# Patient Record
Sex: Male | Born: 1965 | Race: White | Hispanic: No | Marital: Married | Smoking: Never smoker
Health system: Southern US, Community
[De-identification: ages and names within clinical notes are randomized; demographics above are authoritative.]

## PROBLEM LIST (undated history)

## (undated) DIAGNOSIS — N200 Calculus of kidney: Secondary | ICD-10-CM

## (undated) DIAGNOSIS — F329 Major depressive disorder, single episode, unspecified: Secondary | ICD-10-CM

## (undated) DIAGNOSIS — F32A Depression, unspecified: Secondary | ICD-10-CM

## (undated) DIAGNOSIS — I10 Essential (primary) hypertension: Secondary | ICD-10-CM

## (undated) DIAGNOSIS — F419 Anxiety disorder, unspecified: Secondary | ICD-10-CM

## (undated) HISTORY — PX: KIDNEY STONE SURGERY: SHX686

## (undated) HISTORY — DX: Anxiety disorder, unspecified: F41.9

## (undated) HISTORY — DX: Depression, unspecified: F32.A

## (undated) HISTORY — DX: Major depressive disorder, single episode, unspecified: F32.9

---

## 2000-04-22 ENCOUNTER — Encounter: Admission: RE | Admit: 2000-04-22 | Discharge: 2000-04-22 | Payer: Self-pay

## 2002-02-18 ENCOUNTER — Encounter: Payer: Self-pay | Admitting: Orthotics

## 2002-02-18 ENCOUNTER — Encounter: Admission: RE | Admit: 2002-02-18 | Discharge: 2002-02-18 | Payer: Self-pay | Admitting: Orthotics

## 2002-03-18 ENCOUNTER — Encounter: Payer: Self-pay | Admitting: Orthopedic Surgery

## 2002-03-18 ENCOUNTER — Ambulatory Visit (HOSPITAL_COMMUNITY): Admission: RE | Admit: 2002-03-18 | Discharge: 2002-03-18 | Payer: Self-pay | Admitting: Orthopedic Surgery

## 2002-04-10 ENCOUNTER — Ambulatory Visit (HOSPITAL_BASED_OUTPATIENT_CLINIC_OR_DEPARTMENT_OTHER): Admission: RE | Admit: 2002-04-10 | Discharge: 2002-04-10 | Payer: Self-pay | Admitting: Orthopedic Surgery

## 2010-06-28 ENCOUNTER — Encounter: Payer: Self-pay | Admitting: Emergency Medicine

## 2010-06-28 ENCOUNTER — Ambulatory Visit
Admission: RE | Admit: 2010-06-28 | Discharge: 2010-06-28 | Payer: Self-pay | Source: Home / Self Care | Admitting: Emergency Medicine

## 2010-06-28 DIAGNOSIS — I498 Other specified cardiac arrhythmias: Secondary | ICD-10-CM | POA: Insufficient documentation

## 2010-06-29 ENCOUNTER — Ambulatory Visit
Admission: RE | Admit: 2010-06-29 | Discharge: 2010-06-29 | Payer: Self-pay | Source: Home / Self Care | Admitting: Family Medicine

## 2010-06-30 ENCOUNTER — Telehealth (INDEPENDENT_AMBULATORY_CARE_PROVIDER_SITE_OTHER): Payer: Self-pay | Admitting: *Deleted

## 2010-07-06 NOTE — Assessment & Plan Note (Signed)
Summary: POSS SINUS INFECTION, HIGH HEART RATE/WSE   Vital Signs:  Patient Profile:   45 Years Old Male CC:      sinus infection? Height:     73 inches Weight:      303 pounds O2 Sat:      95 % O2 treatment:    Room Air Temp:     98.5 degrees F oral Pulse rate:   160 / minute Resp:     16 per minute BP sitting:   139 / 91  (left arm) Cuff size:   large  Vitals Entered By: Lajean Saver RN (June 28, 2010 7:49 PM)                  Updated Prior Medication List: * OTC SINUS MEDICATION- NAME UNKNOWN   Current Allergies: No known allergies History of Present Illness History from: patient Chief Complaint: sinus infection? History of Present Illness: 45 Years Old Male complains of onset of cold symptoms for 4 days.  Cline has been using OTC cold meds, Benedryl, and Sudafed which is helping a little bit.  He went to [facility] but they said his HR was too high for them to treat him and sent him here.  He doesn't feel any palpitations, CP, SOB, anxiety, chest tightness, light-headedness.  He had no idea that his heart was beating fast.  He has been told in the past that his heartrate has gone up into the low 100's especially when sick.  All that he feels now is head congestion, mild coughing, feverish (has fluctuated today from 97-100), mild fatigue.  No N/V.  He hasn't eaten or had much to drink today.  REVIEW OF SYSTEMS Constitutional Symptoms      Denies fever, chills, night sweats, weight loss, weight gain, and fatigue.  Eyes       Denies change in vision, eye pain, eye discharge, glasses, contact lenses, and eye surgery. Ear/Nose/Throat/Mouth       Complains of ear pain, dizziness, sinus problems, and sore throat.      Denies hearing loss/aids, change in hearing, ear discharge, frequent runny nose, frequent nose bleeds, hoarseness, and tooth pain or bleeding.      Comments: congestion Respiratory       Complains of shortness of breath.      Denies dry cough, productive  cough, wheezing, asthma, bronchitis, and emphysema/COPD.  Cardiovascular       Denies murmurs, chest pain, and tires easily with exhertion.    Gastrointestinal       Denies stomach pain, nausea/vomiting, diarrhea, constipation, blood in bowel movements, and indigestion. Genitourniary       Denies painful urination, kidney stones, and loss of urinary control. Neurological       Complains of headaches.      Denies paralysis, seizures, and fainting/blackouts. Musculoskeletal       Denies muscle pain, joint pain, joint stiffness, decreased range of motion, redness, swelling, muscle weakness, and gout.  Skin       Denies bruising, unusual mles/lumps or sores, and hair/skin or nail changes.  Psych       Denies mood changes, temper/anger issues, anxiety/stress, speech problems, depression, and sleep problems. Other Comments: x 4 days.    Past History:  Past Medical History: Unremarkable  Past Surgical History: Vasectomy Ulcer  Social History: Never Smoked Alcohol use-no Drug use-no Married Smoking Status:  never Drug Use:  no Physical Exam General appearance: well developed, well nourished, no acute distress, +diaphoresis Head:  normocephalic, atraumatic Pupils: PERRLA EOMI Ears: normal, no lesions or deformities Nasal: mucosa pink, nonedematous, no septal deviation, turbinates normal Oral/Pharynx: tongue normal, posterior pharynx without erythema or exudate Neck: neck supple,  trachea midline, no masses.  No carotid bruits Chest/Lungs: no rales, wheezes, or rhonchi bilateral, breath sounds equal without effort Heart: regular rate and tachycardic (about 120 bpm) no murmur Abdomen: soft, non-tender without obvious organomegaly.  No AAA felt but patient is obese. Extremities: FROM all extremities Neurological: no gross deficit Skin: no obvious rashes or lesions MSE: oriented to time, place, and person Assessment New Problems: ATRIAL TACHYCARDIA (ICD-427.89) UPPER  RESPIRATORY INFECTION, ACUTE (ICD-465.9)   Plan New Medications/Changes: AMOXICILLIN 875 MG TABS (AMOXICILLIN) 1 by mouth two times a day for 7 days  #14 x 0, 06/28/2010, Hoyt Koch MD  New Orders: Rocephin  250mg  [Z6109] Admin of Therapeutic Inj  intramuscular or subcutaneous [96372] New Patient Level IV [99204] Pulse Oximetry (single measurment) [94760] EKG w/ Interpretation [93000] Planning Comments:   Take the prescribed antibiotic as instructed.  A gram of Rocephin was also given in clinic. Use nasal saline solution (over the counter) at least 3 times a day.  Avoid cough and cold medicines for now since your HR is high.  An EKG was done with was normal other than a HR=120.  Can take tylenol every 6 hours or motrin every 8 hours for pain or fever.  I have asked the patient to return first thing in the morning.  I suggest that labwork (CBC, BMP, TSH) is done at that time as well as a possible CXR.  I have given the patient strict instructions for going to the ER for any worsening of symptoms (CP, SOB, etc).  His high HR appears to be mild SVT and is explained by his current illness/sinusitis and partial dehydration from today.  I don't believe it is a cardiac issue at this point, but we will likely have him follow up with cardiology as a precaution.  If it continues, his PCP may consider a CCB for prophylaxis.  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  We will not charge another copay when he returns tomorrow morning.   The patient and/or caregiver has been counseled thoroughly with regard to medications prescribed including dosage, schedule, interactions, rationale for use, and possible side effects and they verbalize understanding.  Diagnoses and expected course of recovery discussed and will return if not improved as expected or if the condition worsens. Patient and/or caregiver verbalized understanding.  Prescriptions: AMOXICILLIN 875 MG TABS (AMOXICILLIN) 1 by mouth two times  a day for 7 days  #14 x 0   Entered and Authorized by:   Hoyt Koch MD   Signed by:   Hoyt Koch MD on 06/28/2010   Method used:   Print then Give to Patient   RxID:   [id number]   Medication Administration  Injection # 1:    Medication: Rocephin  250mg     Diagnosis: UPPER RESPIRATORY INFECTION, ACUTE (ICD-465.9)    Route: IM    Site: RUOQ gluteus    Exp Date: 10/02/2012    Lot #: [id number]    Mfr: Sandoz    Comments: 1gm given    Patient tolerated injection without complications    Given by: Lajean Saver RN (June 28, 2010 8:20 PM)  Orders Added: 1)  Rocephin  250mg  [J0696] 2)  Admin of Therapeutic Inj  intramuscular or subcutaneous [96372] 3)  New Patient Level IV [99204] 4)  Pulse  Oximetry (single measurment) [94760] 5)  EKG w/ Interpretation [93000]

## 2010-07-06 NOTE — Assessment & Plan Note (Signed)
Summary: RE-CHECK RAPID HEART RATE,SICK/WSE (rm 3)   Vital Signs:  Patient Profile:   45 Years Old Male CC:      re-check HR and sinus infection Height:     73 inches Weight:      302 pounds O2 Sat:      96 % O2 treatment:    Room Air Temp:     98.7 degrees F oral Pulse rate:   90 / minute Resp:     14 per minute BP sitting:   128 / 88  (left arm) Cuff size:   large  Vitals Entered By: Lajean Saver RN (June 29, 2010 9:40 AM)                  Updated Prior Medication List: * OTC SINUS MEDICATION- NAME UNKNOWN  AMOXICILLIN 875 MG TABS (AMOXICILLIN) 1 by mouth two times a day for 7 days  Current Allergies: No known allergies History of Present Illness Chief Complaint: re-check HR and sinus infection History of Present Illness:  Subjective:  Patient reports that his cold-like symptoms seem about the same, but he has discontinued decongestants.  Still has sinus congestion, and cough increased last night.  No palpitations or chest pain.  Still has chills at night.  No past history of cardiovascular problems.  No family history of thyroid disorders  REVIEW OF SYSTEMS Constitutional Symptoms       Complains of chills and fatigue.     Denies fever, night sweats, weight loss, and weight gain.  Eyes       Denies change in vision, eye pain, eye discharge, glasses, contact lenses, and eye surgery. Ear/Nose/Throat/Mouth       Complains of frequent runny nose, sinus problems, and sore throat.      Denies hearing loss/aids, change in hearing, ear pain, ear discharge, dizziness, frequent nose bleeds, hoarseness, and tooth pain or bleeding.  Respiratory       Complains of productive cough.      Denies dry cough, wheezing, shortness of breath, asthma, bronchitis, and emphysema/COPD.  Cardiovascular       Denies murmurs, chest pain, and tires easily with exhertion.    Gastrointestinal       Denies stomach pain, nausea/vomiting, diarrhea, constipation, blood in bowel movements, and  indigestion. Genitourniary       Denies painful urination, kidney stones, and loss of urinary control. Neurological       Complains of headaches.      Denies paralysis, seizures, and fainting/blackouts. Musculoskeletal       Denies muscle pain, joint pain, joint stiffness, decreased range of motion, redness, swelling, muscle weakness, and gout.  Skin       Denies bruising, unusual mles/lumps or sores, and hair/skin or nail changes.  Psych       Denies mood changes, temper/anger issues, anxiety/stress, speech problems, depression, and sleep problems. Other Comments: Patient seen last night for tachycardia and ?sinus infection. Dr. Orson Aloe wanted him to return today to be re-checked. HR is now normal. He feels no different otehr than his thorat hurting a little more than last night.    Past History:  Past Medical History: Reviewed history from 06/28/2010 and no changes required. Unremarkable  Past Surgical History: Reviewed history from 06/28/2010 and no changes required. Vasectomy Ulcer  Family History: none  Social History: Reviewed history from 06/28/2010 and no changes required. Never Smoked Alcohol use-no Drug use-no Married   Objective:  No acute distress, appears comfortable and alert. Eyes:  Pupils are equal, round, and reactive to light and accomdation.  Extraocular movement is intact.  Conjunctivae are not inflamed.  Ears:  Canals normal.  Tympanic membranes normal.   Nose:  Congested bilaterally; no sinus tenderness Pharynx:  Normal  Neck:  Supple.   Tender shotty posterior nodes are palpated bilaterally.  Lungs:  Clear to auscultation.  Breath sounds are equal.  Heart:  Regular rate and rhythm without murmurs, rubs, or gallops.  Rate 88 Abdomen:  Nontender without masses or hepatosplenomegaly.  Bowel sounds are present.  No CVA or flank tenderness.  Extremities:  No edema.  Assessment  Assessed ATRIAL TACHYCARDIA as improved - Donna Christen MD TACHYCARDIA  RESOLVED; PROBABLY A RESULT OF HIS VIRAL ILLNESS AND INGESTION OF DECONGESTANT  Plan New Medications/Changes: BENZONATATE 200 MG CAPS (BENZONATATE) One by mouth hs as needed cough  #12 x 0, 06/29/2010, Donna Christen MD  New Orders: Est. Patient Level II [phone] Planning Comments:   Advised to avoid decongestants;  begin plain Mucinex with plenty of fluids.  May use Afrin nasal spray for several days.  Rx for Tessalon at night.  Finish amoxicillin. Recommend that he monitor his pulse.  If it increases or remains elevated after his URI resolves, recommend follow-up   The patient and/or caregiver has been counseled thoroughly with regard to medications prescribed including dosage, schedule, interactions, rationale for use, and possible side effects and they verbalize understanding.  Diagnoses and expected course of recovery discussed and will return if not improved as expected or if the condition worsens. Patient and/or caregiver verbalized understanding.  Prescriptions: BENZONATATE 200 MG CAPS (BENZONATATE) One by mouth hs as needed cough  #12 x 0   Entered and Authorized by:   Donna Christen MD   Signed by:   Donna Christen MD on 06/29/2010   Method used:   Print then Give to Patient   RxID:   [id number]   Orders Added: 1)  Est. Patient Level II [32440]

## 2010-07-06 NOTE — Progress Notes (Signed)
  Phone Note Outgoing Call Call back at [facility] Phone [phone]   Call placed by: Lajean Saver RN,  June 30, 2010 8:40 AM Call placed to: Patient Summary of Call: Callback: No answer. Message left with reason for call and call back with any questions or concerns

## 2010-07-27 ENCOUNTER — Encounter: Payer: Self-pay | Admitting: Emergency Medicine

## 2010-08-01 NOTE — Letter (Signed)
Summary: External Correspondence  External Correspondence   Imported By: Dannette Barbara 07/27/2010 11:48:13  _____________________________________________________________________  External Attachment:    Type:   Image     Comment:   External Document

## 2013-06-23 DIAGNOSIS — G4730 Sleep apnea, unspecified: Secondary | ICD-10-CM | POA: Insufficient documentation

## 2013-08-05 ENCOUNTER — Encounter (INDEPENDENT_AMBULATORY_CARE_PROVIDER_SITE_OTHER): Payer: Self-pay

## 2013-08-05 ENCOUNTER — Ambulatory Visit (INDEPENDENT_AMBULATORY_CARE_PROVIDER_SITE_OTHER): Payer: 59 | Admitting: Behavioral Health

## 2013-08-05 ENCOUNTER — Encounter (HOSPITAL_COMMUNITY): Payer: Self-pay | Admitting: Behavioral Health

## 2013-08-05 DIAGNOSIS — F4323 Adjustment disorder with mixed anxiety and depressed mood: Secondary | ICD-10-CM

## 2013-08-05 DIAGNOSIS — F332 Major depressive disorder, recurrent severe without psychotic features: Secondary | ICD-10-CM

## 2013-08-05 NOTE — Progress Notes (Signed)
THERAPIST PROGRESS NOTE  Session Time: 11:00  Participation Level: Active  Behavioral Response: CasualAlertDepressed/anxious  Type of Therapy: Individual Therapy  Treatment Goals addressed: Coping  Interventions: CBT  Summary: Christian Orr is a 48 y.o. male who presents with anxiety and depression.   Suicidal/Homicidal: Nowithout intent/plan  Therapist Response: The client indicated that in his anxiety level and fresh and have gone up since the suicide of his son in November of 2014. He indicated that there have been a lot of changes over the past few weeks. He stated that he had not properly grieve because he always been a caregiver was trying to take care of his wife who is struggling with her son's death and to take care of his son.  He stated that his parents are trying to help take some of the financial burden off and white became offended not her actions and is now angry at them. He indicated that while having lunch with his parents she takes at him saying that he let his living son get away with too much and that he was not supportive of her. He indicated that he became highly anxious and felt like he was having chest pains. He stated that his parents took him to the emergency room. At the emergency room and said it was primarily stress but no heart related issues. He did say there may be some issues with esophagus. He reported that his wife has been acting differently. He indicates that she has gone to her mother's for the past week and plans to go for this weekend saying that she does not want to be in the house anymore in initially saying she did not want to be with him anymore. She told him that she has several friends who are helping to be her" heart holders" but that he was not doing that. He says that she is not able to cook or clean or do anything around the house so he comes home from work bringing home dinner and then cleaning up in and is exhausted he typically goes to  bed. He states that his wife takes her medication and falls asleep at 8:30 or 9. I suggested that he forgets dishes in the sink and 3 time every night to listen to his wife's grief. He did say that she is back on her medication but that he became concerned because she said she wanted to see what her son felt like before he died because he had stopped taking his medications thinking he was doing well. The client indicated that he checks twice face book count and she has middle-aged you also lost a son who she has been talking to loud. He stated that he saw she had search for how to make a hangman's noose and how to overdose. The wife is seeing a therapist at hospice and in private practice. He does not feel she is suicidal now but certainly is concerned about what she was researching. He did say that this morning things felt a little better and that she told him she loved him and she did not want to leave him but they need to do some work. I told him that   with the suicide of her son that type of grief could either make or break marriage and that is going to take extraordinary work on both their parts to make this work. The client indicates that he wants the marriage to work and will do all he can to make  it work. He will continue to see his foster therapy for grief work and is also going to a grief share program. I will see him again next week. He does contract for safety saying he has no thoughts of hurting himself or anyone else but is simply heartbroken.  He did say that while in the hospital he was given Klonopin which helped him sleep but when he went to his medical Dr. she would not continue to prescription. I encouraged him to speak to his medical Dr. again or to get an appointment with a psychiatrist for medication evaluation because he is suffering from stress, grief, and depression. He indicated that he will consider and let me know next week  Return again in 1 weeks.  Diagnosis: Axis I:  depression    Axis II: Deferred    Raife Lizer M, LPC 08/05/2013

## 2013-08-11 ENCOUNTER — Ambulatory Visit (HOSPITAL_COMMUNITY): Payer: Self-pay | Admitting: Behavioral Health

## 2013-08-12 ENCOUNTER — Ambulatory Visit (INDEPENDENT_AMBULATORY_CARE_PROVIDER_SITE_OTHER): Payer: 59 | Admitting: Behavioral Health

## 2013-08-12 ENCOUNTER — Encounter (HOSPITAL_COMMUNITY): Payer: Self-pay | Admitting: Behavioral Health

## 2013-08-12 DIAGNOSIS — F4323 Adjustment disorder with mixed anxiety and depressed mood: Secondary | ICD-10-CM

## 2013-08-12 NOTE — Progress Notes (Signed)
   THERAPIST PROGRESS NOTE  Session Time: 9:00  Participation Level: Active  Behavioral Response: CasualAlertDepressed  Type of Therapy: Individual Therapy  Treatment Goals addressed: Coping  Interventions: CBT  Summary: Christian DoomMark A Orr is a 48 y.o. male who presents with grief.   Suicidal/Homicidal: Nowithout intent/plan  Therapist Response: The patient did indicate that he made more of an effort to be more physically and emotionally available to his wife. He indicated that she did meet with the other male who is also son and that appeared to be helpful to her. He stated that he has been so strong attention to be so strong and take care of his wife they realized he had not grieved yet. He became tearful several times during the session saying a similar treatment both he and his wife have had. Each of the dreams involved speaking to her son who died in November 2014. In the patient's stream his understanding with his grandmother. The son was hoping to babysit the clients wife in his care he and his grandmother was only one baby that his wife had miscarried. He and his wife stream the son spoke to the mom and told her to his job was to take care of the babies or miscarried and to play trumpet in [employer]. We also talked about ways the client could make himself available for the wife and different ways that he could work that so that she would know he was making the effort. The client does contract for safety having no thoughts of hurting himself or anyone else  Plan: Return again in 2 weeks.  Diagnosis: Axis I: 309.28    Axis II: Deferred    French AnaETERS,[name] M, [facility] 08/12/2013

## 2013-12-17 ENCOUNTER — Encounter: Payer: Self-pay | Admitting: Emergency Medicine

## 2013-12-17 ENCOUNTER — Emergency Department
Admission: EM | Admit: 2013-12-17 | Discharge: 2013-12-17 | Disposition: A | Discharge status: Home / Self Care | Payer: 59 | Source: Home / Self Care | Attending: Family Medicine | Admitting: Family Medicine

## 2013-12-17 DIAGNOSIS — H6981 Other specified disorders of Eustachian tube, right ear: Secondary | ICD-10-CM

## 2013-12-17 DIAGNOSIS — H6980 Other specified disorders of Eustachian tube, unspecified ear: Secondary | ICD-10-CM

## 2013-12-17 MED ORDER — PREDNISONE 20 MG PO TABS: 20.0000 mg | ORAL_TABLET | Freq: Two times a day (BID) | ORAL | Status: DC

## 2013-12-17 MED ORDER — AMOXICILLIN 875 MG PO TABS: 875.0000 mg | ORAL_TABLET | Freq: Two times a day (BID) | ORAL | Status: DC

## 2013-12-17 NOTE — Discharge Instructions (Signed)
May take Sudafed for sinus congestion.   Increase fluid intake. May use Afrin nasal spray (or generic oxymetazoline) twice daily for about 5 days.  Also recommend using saline nasal spray several times daily and saline nasal irrigation (AYR is a common brand)

## 2013-12-17 NOTE — ED Provider Notes (Signed)
CSN: [id number]     Arrival date & time 12/17/13  1942 History   First MD Initiated Contact with Patient 12/17/13 2008     Chief Complaint  Patient presents with  . Otalgia      HPI Comments: Patient complains of increased nasal congestion and post-nasal drainage during the past several days.  Yesterday morning he felt as if his right ear was clogged and he could not equalize pressure in the right ear.  He also noticed some brief tinnitus in the right ear yesterday.  His right ear continues to feel clogged.  No URI symptoms.  No fevers, chills, and sweats.  He feels well otherwise. He uses CPAP and has noted increased sinus congestion each morning.  The history is provided by the patient.    Past Medical History  Diagnosis Date  . Anxiety   . Depression    History reviewed. No pertinent past surgical history. History reviewed. No pertinent family history. History  Substance Use Topics  . Smoking status: Never Smoker   . Smokeless tobacco: Not on file  . Alcohol Use: Yes    Review of Systems No sore throat + mild cough No pleuritic pain No wheezing + nasal congestion + post-nasal drainage ? sinus pain/pressure No itchy/red eyes + right earache No hemoptysis No SOB No fever/chills No nausea No vomiting No abdominal pain No diarrhea No urinary symptoms No skin rash No fatigue No myalgias No headache Used OTC meds without relief   Allergies  Review of patient's allergies indicates no known allergies.  Home Medications   Prior to Admission medications   Medication Sig Start Date End Date Taking? Authorizing Provider  amoxicillin (AMOXIL) 875 MG tablet Take 1 tablet (875 mg total) by mouth 2 (two) times daily. 12/17/13   Lattie HawStephen A Beese, MD  predniSONE (DELTASONE) 20 MG tablet Take 1 tablet (20 mg total) by mouth 2 (two) times daily. Take with food. 12/17/13   Lattie HawStephen A Beese, MD   BP 139/91  Pulse 92  Temp(Src) 98.3 F (36.8 C) (Oral)  Ht 6\' 1"  (1.854 m)  Wt  290 lb (131.543 kg)  BMI 38.27 kg/m2  SpO2 96% Physical Exam Nursing notes and Vital Signs reviewed. Appearance:  Patient appears stated age, and in no acute distress.  Patient is obese (BMI 38.3) Eyes:  Pupils are equal, round, and reactive to light and accomodation.  Extraocular movement is intact.  Conjunctivae are not inflamed  Ears:  Canals normal.  Tympanic membranes normal.  Nose:   Congested turbinates.  Mild maxillary sinus tenderness is present.  Pharynx:  Normal Neck:  Supple.  Slightly tender shotty left posterior nodes are palpated bilaterally  Skin:  No rash present.   ED Course  Procedures  none    Labs Reviewed - Tympanogram:  Normal left ear; Positive peak pressure right ear       MDM   1. Eustachian tube dysfunction, right    Begin amoxicillin and prednisone burst. May take Sudafed for sinus congestion.   Increase fluid intake. May use Afrin nasal spray (or generic oxymetazoline) twice daily for about 5 days.  Also recommend using saline nasal spray several times daily and saline nasal irrigation (AYR is a common brand) Followup with ENT if not cleared in 10 days.    Lattie HawStephen A Beese, MD 12/17/13 2040

## 2013-12-17 NOTE — ED Notes (Signed)
Christian Orr complains of right ear pressure/pain and changes in his hearing for 2 days. He has had a dry cough and some post nasal drip for 4 days. Denies fever, chills or sweats.

## 2015-05-24 DIAGNOSIS — N201 Calculus of ureter: Secondary | ICD-10-CM | POA: Insufficient documentation

## 2016-09-18 ENCOUNTER — Emergency Department (INDEPENDENT_AMBULATORY_CARE_PROVIDER_SITE_OTHER): Payer: 59

## 2016-09-18 ENCOUNTER — Encounter: Payer: Self-pay | Admitting: *Deleted

## 2016-09-18 ENCOUNTER — Emergency Department (INDEPENDENT_AMBULATORY_CARE_PROVIDER_SITE_OTHER)
Admission: EM | Admit: 2016-09-18 | Discharge: 2016-09-18 | Disposition: A | Discharge status: Home / Self Care | Payer: 59 | Source: Home / Self Care | Attending: Emergency Medicine | Admitting: Emergency Medicine

## 2016-09-18 DIAGNOSIS — J9811 Atelectasis: Secondary | ICD-10-CM | POA: Diagnosis not present

## 2016-09-18 DIAGNOSIS — R059 Cough, unspecified: Secondary | ICD-10-CM

## 2016-09-18 DIAGNOSIS — R05 Cough: Secondary | ICD-10-CM

## 2016-09-18 DIAGNOSIS — J209 Acute bronchitis, unspecified: Secondary | ICD-10-CM | POA: Diagnosis not present

## 2016-09-18 DIAGNOSIS — R079 Chest pain, unspecified: Secondary | ICD-10-CM

## 2016-09-18 DIAGNOSIS — J0101 Acute recurrent maxillary sinusitis: Secondary | ICD-10-CM

## 2016-09-18 MED ORDER — METHYLPREDNISOLONE SODIUM SUCC 125 MG IJ SOLR
125.0000 mg | Freq: Once | INTRAMUSCULAR | Status: AC
  Administered 2016-09-18: 125 mg via INTRAMUSCULAR

## 2016-09-18 MED ORDER — PREDNISONE 10 MG PO TABS: ORAL_TABLET | ORAL | 0 refills | Status: DC

## 2016-09-18 MED ORDER — AMOXICILLIN-POT CLAVULANATE 875-125 MG PO TABS: 1.0000 | ORAL_TABLET | Freq: Two times a day (BID) | ORAL | 0 refills | Status: DC

## 2016-09-18 MED ORDER — CEFTRIAXONE SODIUM 1 G IJ SOLR
1.0000 g | INTRAMUSCULAR | Status: AC
  Administered 2016-09-18: 1 g via INTRAMUSCULAR

## 2016-09-18 MED ORDER — PROMETHAZINE-CODEINE 6.25-10 MG/5ML PO SYRP: ORAL_SOLUTION | ORAL | 0 refills | Status: DC

## 2016-09-18 NOTE — ED Provider Notes (Addendum)
[facility]    CSN: [id number] Arrival date & time: 09/18/16  1736     History   Chief Complaint Chief Complaint  Patient presents with  . Cough  . Sinus Problem    HPI Christian Orr is a 51 y.o. male.   HPI URI HISTORY  Christian Orr is a 51 y.o. male who complains of onset of Chest congestion, sinus congestion, 6 days, progressively worsening. In the past 2 days, fever and chills and a burning chest congested feeling when he is having hacking nonproductive cough. Bilateral sharp chest pain laterally, right and left, whenever he coughs. Positive chills/sweats +  He thinks he has had a low-grade Fever  +  Nasal congestion +  Discolored Post-nasal drainage Positive sinus pain/pressure No sore throat  +  cough No wheezing Positive chest congestion No hemoptysis No shortness of breath No pleuritic pain  No itchy/red eyes No earache, but has bilateral ear pressure.  No nausea No vomiting No abdominal pain No diarrhea  No skin rashes +  Fatigue No myalgias No headache   Past Medical History:  Diagnosis Date  . Anxiety   . Depression     Patient Active Problem List   Diagnosis Date Noted  . ATRIAL TACHYCARDIA 06/28/2010    History reviewed. No pertinent surgical history.     Home Medications    Prior to Admission medications   Medication Sig Start Date End Date Taking? Authorizing Provider  amoxicillin-clavulanate (AUGMENTIN) 875-125 MG tablet Take 1 tablet by mouth every 12 (twelve) hours. Take for 10 days. Take with food. 09/18/16   Lajean Manes, MD  predniSONE (DELTASONE) 10 MG tablet Take as directed for 6 days.--Take 6 on day 1, 5 on day 2, 4 on day 3, then 3 tablets on day 4, then 2 tablets on day 5, then 1 on day 6. 09/18/16   Lajean Manes, MD  promethazine-codeine [facility] WITH CODEINE) 6.25-10 MG/5ML syrup Take 1-2 teaspoons every 4-6 hours as needed for cough. Caution: May cause drowsiness. 09/18/16   Lajean Manes, MD    Family  History History reviewed. No pertinent family history.  Social History Social History  Substance Use Topics  . Smoking status: Never Smoker  . Smokeless tobacco: Never Used  . Alcohol use Yes     Allergies   Patient has no known allergies.   Review of Systems Review of Systems  All other systems reviewed and are negative.    Physical Exam Triage Vital Signs ED Triage Vitals [09/18/16 1800]  Enc Vitals Group     BP 137/87     Pulse Rate 99     Resp 16     Temp 98.1 F (36.7 C)     Temp Source Oral     SpO2 96 %     Weight (!) 316 lb (143.3 kg)     Height      Head Circumference      Peak Flow      Pain Score 6     Pain Loc      Pain Edu?      Excl. in GC?    No data found.   Updated Vital Signs BP 137/87 (BP Location: Left Arm)   Pulse 99   Temp 98.1 F (36.7 C) (Oral)   Resp 16   Wt (!) 316 lb (143.3 kg)   SpO2 96%   BMI 41.69 kg/m    Physical Exam  Constitutional: He is oriented to person, place, and  time. He appears well-developed and well-nourished. No distress.  Appears fatigued and acutely ill but not toxic. Hacking cough noted  HENT:  Head: Normocephalic and atraumatic.  Right Ear: Tympanic membrane normal.  Left Ear: Tympanic membrane normal.  Mouth/Throat: Oropharynx is clear and moist. No oropharyngeal exudate.  Nose: Boggy turbinates with seromucoid drainage. +Bilateral maxillary tenderness.  Eyes: Right eye exhibits no discharge. Left eye exhibits no discharge. No scleral icterus.  Neck: Neck supple.  Cardiovascular: Normal rate, regular rhythm and normal heart sounds.   Pulmonary/Chest: No respiratory distress. He has wheezes. He has rhonchi.  Questionable bibasilar rales that clear after coughing.  Breath sounds equal bilaterally.  Abdominal: He exhibits no distension.  Musculoskeletal: He exhibits no tenderness.  Lymphadenopathy:    He has no cervical adenopathy.  Neurological: He is alert and oriented to person, place, and  time. No cranial nerve deficit.  Skin: Skin is warm and dry. No rash noted.  Nursing note and vitals reviewed.    UC Treatments / Results  Labs (all labs ordered are listed, but only abnormal results are displayed) Labs Reviewed - No data to display  EKG  EKG Interpretation None       Radiology Dg Chest 2 View  Result Date: 09/18/2016 CLINICAL DATA:  Cough with burning sensation in the middle of the chest EXAM: CHEST  2 VIEW COMPARISON:  None. FINDINGS: Minimal left basilar atelectasis. No consolidation or effusion. Normal cardiomediastinal silhouette. No pneumothorax. IMPRESSION: Minimal left basilar atelectasis. Otherwise no radiographic evidence for acute cardiopulmonary abnormality Electronically Signed   By: Jasmine Pang M.D.   On: 09/18/2016 18:32    Procedures Procedures (including critical care time)  Medications Ordered in UC Medications  cefTRIAXone (ROCEPHIN) injection 1 g (1 g Intramuscular Given 09/18/16 1857)  methylPREDNISolone sodium succinate (SOLU-MEDROL) 125 mg/2 mL injection 125 mg (125 mg Intramuscular Given 09/18/16 1857)     Initial Impression / Assessment and Plan / UC Course  I have reviewed the triage vital signs and the nursing notes.  Pertinent labs & imaging results that were available during my care of the patient were reviewed by me and considered in my medical decision making (see chart for details).    Chest x-ray shows "minimal left basilar atelectasis" Otherwise, CXR shows no definite infiltrates or any other significant acute cardio pulmonary abnormality.   Final Clinical Impressions(s) / UC Diagnoses  Likely has severe bronchitis and sinusitis. No definite infiltrate, chest x-ray normal except minimal left nasal or atelectasis. It's possible that he could have a very early "walking pneumonia", based on history and clinical exam.  Final diagnoses:  Cough  Chest pain  Acute bronchitis, unspecified organism  Acute recurrent  maxillary sinusitis   Treatment options discussed, as well as risks, benefits, alternatives. Patient voiced understanding and agreement with the following plans:   cefTRIAXone (ROCEPHIN) injection 1 g  Intramuscular Given 09/18/16 1857)   (SOLU-MEDROL) 125 mg/2 mL injection 125 mg (125 mg Intramuscular Given 09/18/16 1857)    New Prescriptions Discharge Medication List as of 09/18/2016  6:52 PM    START taking these medications   Details  amoxicillin-clavulanate (AUGMENTIN) 875-125 MG tablet Take 1 tablet by mouth every 12 (twelve) hours. Take for 10 days. Take with food., Starting Tue 09/18/2016, Print    predniSONE (DELTASONE) 10 MG tablet Take as directed for 6 days.--Take 6 on day 1, 5 on day 2, 4 on day 3, then 3 tablets on day 4, then 2 tablets on day 5, then  1 on day 6., Print    promethazine-codeine (PHENERGAN WITH CODEINE) 6.25-10 MG/5ML syrup Take 1-2 teaspoons every 4-6 hours as needed for cough. Caution: May cause drowsiness., Print      Other symptomatic care discussed. Follow-up with your primary care doctor in 5 days if not improving, or sooner if symptoms become worse. Precautions discussed. Red flags discussed.-Emergency room if any red flag Questions invited and answered. Patient voiced understanding and agreement.    Lajean Manes, MD 09/20/16 1513    Lajean Manes, MD 09/20/16 [phone]

## 2016-09-18 NOTE — Discharge Instructions (Signed)
Today, chest x-ray does NOT show pneumonia. You have a severe bronchitis and sinus infection. Today, we gave you shot of Rocephin (antibiotic) and shot of Solu-Medrol (steroid). See directions for other 3 written prescriptions.  Follow-up with your doctor if no better one week, sooner if worse.

## 2016-09-18 NOTE — ED Triage Notes (Signed)
Patient c/o 5-6 days of dry cough, sinus pain and eye pressure and now chest pain/burning with cough. Afebrile. Taken [employer].

## 2016-10-07 ENCOUNTER — Emergency Department (INDEPENDENT_AMBULATORY_CARE_PROVIDER_SITE_OTHER): Payer: 59

## 2016-10-07 ENCOUNTER — Emergency Department
Admission: EM | Admit: 2016-10-07 | Discharge: 2016-10-07 | Disposition: A | Discharge status: Home / Self Care | Payer: 59 | Source: Home / Self Care | Attending: Family Medicine | Admitting: Family Medicine

## 2016-10-07 DIAGNOSIS — R05 Cough: Secondary | ICD-10-CM

## 2016-10-07 DIAGNOSIS — B9689 Other specified bacterial agents as the cause of diseases classified elsewhere: Secondary | ICD-10-CM | POA: Diagnosis not present

## 2016-10-07 DIAGNOSIS — R079 Chest pain, unspecified: Secondary | ICD-10-CM

## 2016-10-07 DIAGNOSIS — R059 Cough, unspecified: Secondary | ICD-10-CM

## 2016-10-07 DIAGNOSIS — J329 Chronic sinusitis, unspecified: Secondary | ICD-10-CM

## 2016-10-07 MED ORDER — IBUPROFEN 800 MG PO TABS
800.0000 mg | ORAL_TABLET | Freq: Once | ORAL | Status: AC
  Administered 2016-10-07: 800 mg via ORAL

## 2016-10-07 MED ORDER — PREDNISONE 50 MG PO TABS
60.0000 mg | ORAL_TABLET | Freq: Once | ORAL | Status: AC
  Administered 2016-10-07: 60 mg via ORAL

## 2016-10-07 MED ORDER — PREDNISONE 20 MG PO TABS: 40.0000 mg | ORAL_TABLET | Freq: Every day | ORAL | 0 refills | Status: DC

## 2016-10-07 MED ORDER — DOXYCYCLINE HYCLATE 100 MG PO CAPS: 100.0000 mg | ORAL_CAPSULE | Freq: Two times a day (BID) | ORAL | 0 refills | Status: DC

## 2016-10-07 MED ORDER — GUAIFENESIN-CODEINE 100-10 MG/5ML PO SYRP: 5.0000 mL | ORAL_SOLUTION | Freq: Three times a day (TID) | ORAL | 0 refills | Status: DC | PRN

## 2016-10-07 NOTE — ED Triage Notes (Signed)
Patient states he was diag approx 3 weeks ago with Bronchitis but is still coughing. States that he was coughing so bad that his ribs hurt now. States when coughing its about a 7-8 and when he is not its about a 4.

## 2016-10-07 NOTE — Discharge Instructions (Signed)
°  Virtussin (guaifenesin-codeine) is a strong narcotic cough medication.  Only take up to 3 times daily as needed for severe cough.  Be sure to take with large glass of water.  It may cause drowsiness. Do not drive, drink alcohol or take other sedating medications such as Nyquil while taking this medication.  ° °

## 2016-10-07 NOTE — ED Provider Notes (Signed)
CSN: [id number]     Arrival date & time 10/07/16  1116 History   First MD Initiated Contact with Patient 10/07/16 1157     Chief Complaint  Patient presents with  . Cough   (Consider location/radiation/quality/duration/timing/severity/associated sxs/prior Treatment) HPI  Christian Orr is a 51 y.o. male presenting to UC with c/o persistent cough with associated Right side chest wall pain with cough.  He was seen at [facility] 3 weeks ago, dx and treated for bronchitis with a steroid shot, prednisone taper, promethazine-codeine cough medication, and Augmentin but he is only moderately better.  Pain in in side is 7-8/10 when coughing.  4/10 at rest. Cough is still mild to moderately productive. He is now experiencing sinus pain and pressure. Denies known fever. Denies n/v/d. No hx of asthma.    Past Medical History:  Diagnosis Date  . Anxiety   . Depression    History reviewed. No pertinent surgical history. No family history on file. Social History  Substance Use Topics  . Smoking status: Never Smoker  . Smokeless tobacco: Never Used  . Alcohol use Yes    Review of Systems  Constitutional: Negative for chills and fever.  HENT: Positive for congestion, postnasal drip, sinus pain, sinus pressure and sore throat. Negative for ear pain, trouble swallowing and voice change.   Respiratory: Positive for cough and chest tightness. Negative for shortness of breath.   Cardiovascular: Positive for chest pain (Right side ribs). Negative for palpitations.  Gastrointestinal: Negative for abdominal pain, diarrhea, nausea and vomiting.  Musculoskeletal: Negative for arthralgias, back pain and myalgias.  Skin: Negative for rash.    Allergies  Patient has no known allergies.  Home Medications   Prior to Admission medications   Medication Sig Start Date End Date Taking? Authorizing Provider  AMLODIPINE BESYLATE PO Take by mouth.   Yes [provider]  LOSARTAN POTASSIUM PO Take by mouth.    Yes [provider]  METOPROLOL TARTRATE PO Take by mouth.   Yes [provider]  amoxicillin-clavulanate (AUGMENTIN) 875-125 MG tablet Take 1 tablet by mouth every 12 (twelve) hours. Take for 10 days. Take with food. 09/18/16   Lajean Manes, MD  doxycycline (VIBRAMYCIN) 100 MG capsule Take 1 capsule (100 mg total) by mouth 2 (two) times daily. One po bid x 7 days 10/07/16   Junius Finner, PA-C  guaiFENesin-codeine [facility] A/C) 100-10 MG/5ML syrup Take 5-10 mLs by mouth 3 (three) times daily as needed for cough or congestion. 10/07/16   Junius Finner, PA-C  predniSONE (DELTASONE) 20 MG tablet Take 2 tablets (40 mg total) by mouth daily with breakfast. For 3 days 10/07/16   Junius Finner, PA-C  promethazine-codeine [facility] WITH CODEINE) 6.25-10 MG/5ML syrup Take 1-2 teaspoons every 4-6 hours as needed for cough. Caution: May cause drowsiness. 09/18/16   Lajean Manes, MD   Meds Ordered and Administered this Visit   Medications  predniSONE (DELTASONE) tablet 60 mg (60 mg Oral Given 10/07/16 1220)  ibuprofen (ADVIL,MOTRIN) tablet 800 mg (800 mg Oral Given 10/07/16 1219)    BP 114/78 (BP Location: Left Arm)   Pulse 82   Temp 98.4 F (36.9 C) (Oral)   Ht 6\' 1"  (1.854 m)   Wt (!) 313 lb (142 kg)   BMI 41.30 kg/m  No data found.   Physical Exam  Constitutional: He appears well-developed and well-nourished. No distress.  HENT:  Head: Normocephalic and atraumatic.  Right Ear: Tympanic membrane normal.  Left Ear: Tympanic membrane normal.  Nose: Mucosal edema present. Right sinus exhibits maxillary sinus tenderness and frontal sinus tenderness. Left sinus exhibits maxillary sinus tenderness and frontal sinus tenderness.  Mouth/Throat: Uvula is midline, oropharynx is clear and moist and mucous membranes are normal.  Eyes: Conjunctivae are normal. No scleral icterus.  Neck: Normal range of motion. Neck supple.  Cardiovascular: Normal rate, regular rhythm and normal heart  sounds.   Pulmonary/Chest: Effort normal and breath sounds normal. No stridor. No respiratory distress. He has no wheezes. He has no rales. He exhibits tenderness.  Abdominal: Soft. He exhibits no distension. There is no tenderness.  Musculoskeletal: Normal range of motion.  Lymphadenopathy:    He has no cervical adenopathy.  Neurological: He is alert.  Skin: Skin is warm and dry. He is not diaphoretic.  Nursing note and vitals reviewed.   Urgent Care Course     Procedures (including critical care time)  Labs Review Labs Reviewed - No data to display  Imaging Review Dg Chest 2 View  Result Date: 10/07/2016 CLINICAL DATA:  Cough for 3 weeks. Right-sided chest pain yesterday. EXAM: CHEST  2 VIEW COMPARISON:  09/18/2016. FINDINGS: Mild lordotic positioning on the frontal examination. The heart size and mediastinal contours are normal. The lungs are clear. There is no pleural effusion or pneumothorax. No acute osseous findings are identified. IMPRESSION: Stable examination.  No active cardiopulmonary process. Electronically Signed   By: Carey BullocksWilliam  Veazey M.D.   On: 10/07/2016 12:11      MDM   1. Bacterial sinusitis   2. Right-sided chest pain   3. Cough    Pt c/o persistent cough, Right side chest pain and sinus pressure.  Will treat for bacterial sinusitis with Doxycycline and prescribe another 3 days of prednisone 40mg . Guafenesin-codeine cough syrup.  f/u with PCP in 1 week if not improving.    Junius FinnerO'Malley, [place] Segler, PA-C 10/07/16 1301

## 2017-08-07 ENCOUNTER — Emergency Department
Admission: EM | Admit: 2017-08-07 | Discharge: 2017-08-07 | Disposition: A | Discharge status: Home / Self Care | Payer: 59 | Source: Home / Self Care | Attending: Family Medicine | Admitting: Family Medicine

## 2017-08-07 ENCOUNTER — Other Ambulatory Visit: Payer: Self-pay

## 2017-08-07 ENCOUNTER — Encounter: Payer: Self-pay | Admitting: *Deleted

## 2017-08-07 DIAGNOSIS — J069 Acute upper respiratory infection, unspecified: Secondary | ICD-10-CM

## 2017-08-07 DIAGNOSIS — B9789 Other viral agents as the cause of diseases classified elsewhere: Secondary | ICD-10-CM | POA: Diagnosis not present

## 2017-08-07 DIAGNOSIS — J9801 Acute bronchospasm: Secondary | ICD-10-CM

## 2017-08-07 HISTORY — DX: Essential (primary) hypertension: I10

## 2017-08-07 LAB — POCT URINALYSIS DIP (MANUAL ENTRY)
Bilirubin, UA: NEGATIVE
GLUCOSE UA: NEGATIVE mg/dL
Ketones, POC UA: NEGATIVE mg/dL
Leukocytes, UA: NEGATIVE
NITRITE UA: NEGATIVE
Protein Ur, POC: NEGATIVE mg/dL
RBC UA: NEGATIVE
Spec Grav, UA: 1.02 (ref 1.010–1.025)
Urobilinogen, UA: 0.2 E.U./dL
pH, UA: 6 (ref 5.0–8.0)

## 2017-08-07 MED ORDER — DOXYCYCLINE HYCLATE 100 MG PO CAPS: 100.0000 mg | ORAL_CAPSULE | Freq: Two times a day (BID) | ORAL | 0 refills | Status: DC

## 2017-08-07 MED ORDER — BENZONATATE 200 MG PO CAPS: ORAL_CAPSULE | ORAL | 0 refills | Status: DC

## 2017-08-07 MED ORDER — METHYLPREDNISOLONE SODIUM SUCC 125 MG IJ SOLR
80.0000 mg | Freq: Once | INTRAMUSCULAR | Status: AC
  Administered 2017-08-07: 80 mg via INTRAMUSCULAR

## 2017-08-07 MED ORDER — PREDNISONE 20 MG PO TABS: ORAL_TABLET | ORAL | 0 refills | Status: DC

## 2017-08-07 NOTE — ED Triage Notes (Signed)
Pt c/o HA, nonproductive cough, sinus pressure and urinary frequency and odor to his urine x 2 wks. Denies fever.

## 2017-08-07 NOTE — Discharge Instructions (Addendum)
Begin prednisone Thursday 08/08/17. Take plain guaifenesin (1200mg  extended release tabs such as Mucinex) twice daily, with plenty of water, for cough and congestion.  Get adequate rest.   May use Afrin nasal spray (or generic oxymetazoline) each morning for about 5 days and then discontinue.  Also recommend using saline nasal spray several times daily and saline nasal irrigation (AYR is a common brand).  Use Flonase nasal spray each morning after using Afrin nasal spray and saline nasal irrigation. Try warm salt water gargles for sore throat.  Stop all antihistamines for now, and other non-prescription cough/cold preparations. May take Delsym Cough Suppressant with Tessalon at bedtime for nighttime cough.

## 2017-08-07 NOTE — ED Provider Notes (Signed)
[facility]    CSN: [id number] Arrival date & time: 08/07/17  0805     History   Chief Complaint Chief Complaint  Patient presents with  . Cough    HPI Christian Orr is a 52 y.o. male.   About 2 weeks ago patient develoed typical cold-like symptoms developing over several days, including mild sore throat, sinus congestion, headache, fatigue, and cough.  The sore throat resolved, but he continues to have nasal congestion, and his cough has become non-productive and worse at night.  He often coughs until he gags, and he complains of tightness over his sternum.  He wears a CPAP, and has developed wheezing at night.  He denies history of asthma, but notes that his son has asthma.  He does not remember his last Tdap.  He also complains of increased urine frequency for about 3 months, without dysuria, hesitancy, or urgency.  He does have mild nocturia.  No decrease in urinary stream, and no incontinence.    The history is provided by the patient.    Past Medical History:  Diagnosis Date  . Anxiety   . Depression   . Hypertension     Patient Active Problem List   Diagnosis Date Noted  . ATRIAL TACHYCARDIA 06/28/2010    Past Surgical History:  Procedure Laterality Date  . KIDNEY STONE SURGERY         Home Medications    Prior to Admission medications   Medication Sig Start Date End Date Taking? Authorizing Provider  AMLODIPINE BESYLATE PO Take by mouth.    [provider]  benzonatate (TESSALON) 200 MG capsule Take one cap by mouth at bedtime as needed for cough.  May repeat in 4 to 6 hours 08/07/17   Lattie HawBeese, Armilda Vanderlinden A, MD  doxycycline (VIBRAMYCIN) 100 MG capsule Take 1 capsule (100 mg total) by mouth 2 (two) times daily. Take with food. 08/07/17   Lattie HawBeese, Nekeshia Lenhardt A, MD  LOSARTAN POTASSIUM PO Take by mouth.    [provider]  METOPROLOL TARTRATE PO Take by mouth.    [provider]  predniSONE (DELTASONE) 20 MG tablet Take one tab by  mouth twice daily for 4 days, then one daily for 3 days. Take with food. 08/07/17   Lattie HawBeese, Cowen Pesqueira A, MD    Family History History reviewed. No pertinent family history.  Social History Social History   Tobacco Use  . Smoking status: Never Smoker  . Smokeless tobacco: Never Used  Substance Use Topics  . Alcohol use: Yes    Comment: 2 q wk  . Drug use: No     Allergies   Patient has no known allergies.   Review of Systems Review of Systems + sore throat, resolved + hoarse + cough No pleuritic pain, but feels tightness in anterior chest + wheezing + nasal congestion + post-nasal drainage + sinus pain/pressure No itchy/red eyes ? earache No hemoptysis No SOB No fever, + chills/sweats No nausea No vomiting No abdominal pain No diarrhea No urinary symptoms No skin rash No fatigue No myalgias + headache Used OTC meds without relief   Physical Exam Triage Vital Signs ED Triage Vitals  Enc Vitals Group     BP      Pulse      Resp      Temp      Temp src      SpO2      Weight      Height  Head Circumference      Peak Flow      Pain Score      Pain Loc      Pain Edu?      Excl. in GC?    No data found.  Updated Vital Signs BP 121/82 (BP Location: Right Arm)   Pulse 79   Temp 98.2 F (36.8 C) (Oral)   Resp 18   Ht 6' (1.829 m)   Wt (!) 321 lb (145.6 kg)   SpO2 97%   BMI 43.54 kg/m   Visual Acuity Right Eye Distance:   Left Eye Distance:   Bilateral Distance:    Right Eye Near:   Left Eye Near:    Bilateral Near:     Physical Exam Nursing notes and Vital Signs reviewed. Appearance:  Patient appears stated age, and in no acute distress Eyes:  Pupils are equal, round, and reactive to light and accomodation.  Extraocular movement is intact.  Conjunctivae are not inflamed  Ears:  Canals normal.  Tympanic membranes normal.  Nose:  Mildly congested turbinates.  No sinus tenderness.   Pharynx:  Normal Neck:  Supple.  Enlarged  posterior/lateral nodes are palpated bilaterally, tender to palpation on the left.   Lungs:  Clear to auscultation.  Breath sounds are equal.  Moving air well. Chest:  Distinct tenderness to palpation over the mid-sternum.  Heart:  Regular rate and rhythm without murmurs, rubs, or gallops.  Abdomen:  Nontender without masses or hepatosplenomegaly.  Bowel sounds are present.  No CVA or flank tenderness.  Extremities:  No edema.  Skin:  No rash present.    UC Treatments / Results  Labs (all labs ordered are listed, but only abnormal results are displayed) Labs Reviewed  POCT URINALYSIS DIP (MANUAL ENTRY) negative    EKG  EKG Interpretation None       Radiology No results found.  Procedures Procedures (including critical care time)  Medications Ordered in UC Medications  methylPREDNISolone sodium succinate (SOLU-MEDROL) 125 mg/2 mL injection 80 mg (not administered)     Initial Impression / Assessment and Plan / UC Course  I have reviewed the triage vital signs and the nursing notes.  Pertinent labs & imaging results that were available during my care of the patient were reviewed by me and considered in my medical decision making (see chart for details).    Note normal urinalysis today; no evidence UTI. Administered Solumedrol 80mg  IM. Begin doxycycline 100mg  BID for atypical coverage (?pertussis). Begin prednisone burst/taper Thursday 08/08/17. Take plain guaifenesin (1200mg  extended release tabs such as Mucinex) twice daily, with plenty of water, for cough and congestion.  Get adequate rest.   May use Afrin nasal spray (or generic oxymetazoline) each morning for about 5 days and then discontinue.  Also recommend using saline nasal spray several times daily and saline nasal irrigation (AYR is a common brand).  Use Flonase nasal spray each morning after using Afrin nasal spray and saline nasal irrigation. Try warm salt water gargles for sore throat.  Stop all antihistamines  for now, and other non-prescription cough/cold preparations. May take Delsym Cough Suppressant with Tessalon at bedtime for nighttime cough.  Followup with Family Doctor if not improved in 10 days.    Final Clinical Impressions(s) / UC Diagnoses   Final diagnoses:  Viral URI with cough  Bronchospasm, acute    ED Discharge Orders        Ordered    predniSONE (DELTASONE) 20 MG tablet  08/07/17 0845    doxycycline (VIBRAMYCIN) 100 MG capsule  2 times daily     08/07/17 0845    benzonatate (TESSALON) 200 MG capsule     08/07/17 0845           Lattie Haw, MD 08/07/17 [phone]

## 2017-08-18 ENCOUNTER — Encounter: Payer: Self-pay | Admitting: Emergency Medicine

## 2017-08-18 ENCOUNTER — Other Ambulatory Visit: Payer: Self-pay

## 2017-08-18 ENCOUNTER — Emergency Department (INDEPENDENT_AMBULATORY_CARE_PROVIDER_SITE_OTHER)
Admission: EM | Admit: 2017-08-18 | Discharge: 2017-08-18 | Disposition: A | Discharge status: Home / Self Care | Payer: 59 | Source: Home / Self Care | Attending: Family Medicine | Admitting: Family Medicine

## 2017-08-18 ENCOUNTER — Emergency Department (INDEPENDENT_AMBULATORY_CARE_PROVIDER_SITE_OTHER): Payer: 59

## 2017-08-18 DIAGNOSIS — R053 Chronic cough: Secondary | ICD-10-CM

## 2017-08-18 DIAGNOSIS — R05 Cough: Secondary | ICD-10-CM

## 2017-08-18 DIAGNOSIS — R0602 Shortness of breath: Secondary | ICD-10-CM | POA: Diagnosis not present

## 2017-08-18 MED ORDER — ONDANSETRON 4 MG PO TBDP
4.0000 mg | ORAL_TABLET | Freq: Once | ORAL | Status: AC
Start: 2017-08-18 — End: 2017-08-18
  Administered 2017-08-18: 4 mg via ORAL

## 2017-08-18 MED ORDER — GUAIFENESIN-CODEINE 100-10 MG/5ML PO SOLN: ORAL | 0 refills | Status: DC

## 2017-08-18 MED ORDER — IPRATROPIUM-ALBUTEROL 0.5-2.5 (3) MG/3ML IN SOLN
3.0000 mL | Freq: Four times a day (QID) | RESPIRATORY_TRACT | Status: DC
  Administered 2017-08-18: 3 mL via RESPIRATORY_TRACT

## 2017-08-18 MED ORDER — AZITHROMYCIN 250 MG PO TABS: ORAL_TABLET | ORAL | 0 refills | Status: DC

## 2017-08-18 NOTE — ED Provider Notes (Signed)
[facility]    CSN: [id number] Arrival date & time: 08/18/17  1518     History   Chief Complaint Chief Complaint  Patient presents with  . Cough    HPI Christian Orr is a 52 y.o. male.   Patient was treated for a URI eleven days ago with doxycycline, prednisone, expectorant, and benzonatate at bedtime.  He had had a cough for about two weeks prior to that time.  He reports that he felt slightly better after finishing antibiotic and prednisone but his cough has persisted, and became worse four days ago.  Last night he developed wheezing, and he now sometimes feels dizzy after persistent coughing.  He has a frontal headache and feels fatigued, but denies fever.  He has mild nausea today.   The history is provided by the patient and the spouse.    Past Medical History:  Diagnosis Date  . Anxiety   . Depression   . Hypertension     Patient Active Problem List   Diagnosis Date Noted  . ATRIAL TACHYCARDIA 06/28/2010    Past Surgical History:  Procedure Laterality Date  . KIDNEY STONE SURGERY         Home Medications    Prior to Admission medications   Medication Sig Start Date End Date Taking? Authorizing Provider  AMLODIPINE BESYLATE PO Take by mouth.    [provider]  azithromycin (ZITHROMAX Z-PAK) 250 MG tablet Take 2 tabs today; then begin one tab once daily for 4 more days. 08/18/17   Lattie HawBeese, Rashard Ryle A, MD  benzonatate (TESSALON) 200 MG capsule Take one cap by mouth at bedtime as needed for cough.  May repeat in 4 to 6 hours 08/07/17   Lattie HawBeese, Terralyn Matsumura A, MD  doxycycline (VIBRAMYCIN) 100 MG capsule Take 1 capsule (100 mg total) by mouth 2 (two) times daily. Take with food. 08/07/17   Lattie HawBeese, Jaymz Traywick A, MD  guaiFENesin-codeine 100-10 MG/5ML syrup Take 10mL by mouth at bedtime as needed for cough.  May repeat dose in 4 to 6 hours. 08/18/17   Lattie HawBeese, Missie Gehrig A, MD  LOSARTAN POTASSIUM PO Take by mouth.    [provider]  METOPROLOL TARTRATE PO  Take by mouth.    [provider]  predniSONE (DELTASONE) 20 MG tablet Take one tab by mouth twice daily for 4 days, then one daily for 3 days. Take with food. 08/07/17   Lattie HawBeese, Shakir Petrosino A, MD    Family History History reviewed. No pertinent family history.  Social History Social History   Tobacco Use  . Smoking status: Never Smoker  . Smokeless tobacco: Never Used  Substance Use Topics  . Alcohol use: Yes    Comment: 2 q wk  . Drug use: No     Allergies   Patient has no known allergies.   Review of Systems Review of Systems No sore throat + cough No pleuritic pain + wheezing ? nasal congestion + post-nasal drainage No sinus pain/pressure No itchy/red eyes No earache No hemoptysis No SOB No fever/chills No nausea No vomiting No abdominal pain No diarrhea No urinary symptoms No skin rash + fatigue No myalgias No headache Used OTC meds without relief   Physical Exam Triage Vital Signs ED Triage Vitals [08/18/17 1550]  Enc Vitals Group     BP 126/80     Pulse Rate (!) 113     Resp 18     Temp 98.6 F (37 C)     Temp Source Oral  SpO2 96 %     Weight      Height      Head Circumference      Peak Flow      Pain Score      Pain Loc      Pain Edu?      Excl. in GC?    No data found.  Updated Vital Signs BP 126/80 (BP Location: Right Arm)   Pulse (!) 113   Temp 98.6 F (37 C) (Oral)   Resp 18   SpO2 96%   Visual Acuity Right Eye Distance:   Left Eye Distance:   Bilateral Distance:    Right Eye Near:   Left Eye Near:    Bilateral Near:     Physical Exam Nursing notes and Vital Signs reviewed. Appearance:  Patient appears stated age, and in no acute distress Eyes:  Pupils are equal, round, and reactive to light and accomodation.  Extraocular movement is intact.  Conjunctivae are not inflamed  Ears:  Canals normal.  Tympanic membranes normal.  Nose:  Mildly congested turbinates.  No sinus tenderness.  Pharynx:  Normal Neck:   Supple.  Mildly enlarged and tender posterior/lateral nodes are palpated. Lungs:  Clear to auscultation.  Breath sounds are equal.  Moving air well. Heart:  Regular rate and rhythm without murmurs, rubs, or gallops.  Abdomen:  Nontender without masses or hepatosplenomegaly.  Bowel sounds are present.  No CVA or flank tenderness.  Extremities:  No edema.  Skin:  No rash present.    UC Treatments / Results  Labs (all labs ordered are listed, but only abnormal results are displayed) Labs Reviewed  BORDETELLA PERTUSSIS AB IGG,IGA  POCT CBC W AUTO DIFF ([facility]):  WBC 11.2 ; LY 23.4; MO 3.2; GR 73.4; Hgb 14.3; Platelets 242     EKG  EKG Interpretation None       Radiology Dg Chest 2 View  Result Date: 08/18/2017 CLINICAL DATA:  Cough and shortness of breath EXAM: CHEST - 2 VIEW COMPARISON:  Oct 07, 2016 FINDINGS: There is no edema or consolidation. Heart size and pulmonary vascularity are normal. No adenopathy. No evident bone lesions. IMPRESSION: No edema or consolidation. Electronically Signed   By: Bretta Bang III M.D.   On: 08/18/2017 16:20    Procedures Procedures (including critical care time)  Medications Ordered in UC Medications  ipratropium-albuterol (DUONEB) 0.5-2.5 (3) MG/3ML nebulizer solution 3 mL (3 mLs Nebulization Given 08/18/17 1632)  ondansetron (ZOFRAN-ODT) disintegrating tablet 4 mg (4 mg Oral Given 08/18/17 1632)     Initial Impression / Assessment and Plan / UC Course  I have reviewed the triage vital signs and the nursing notes.  Pertinent labs & imaging results that were available during my care of the patient were reviewed by me and considered in my medical decision making (see chart for details).    Negative chest x-ray reassuring, but note mild leukocytosis (WBC 11.2). Administered Zofran ODT 4mg  PO. Administered DuoNeb by hand held nebulizer with symptomatic improvement. Check pertussis antibodies and culture. Begin Z-pak. Rx for  Robitussin AC for night time cough. Controlled Substance Prescriptions I have consulted the [place] Controlled Substances Registry for this patient, and feel the risk/benefit ratio today is favorable for proceeding with this prescription for a controlled substance.    Begin Symbicort inhaler:  inhale one puff into the lungs twice daily (sample canister 160/4.5). Continue Mucinex to loosen mucous. Followup with pulmonologist if not improved about 10 days.  Final Clinical Impressions(s) / UC Diagnoses   Final diagnoses:  Persistent cough for 3 weeks or longer    ED Discharge Orders        Ordered    azithromycin (ZITHROMAX Z-PAK) 250 MG tablet     08/18/17 1811    guaiFENesin-codeine 100-10 MG/5ML syrup     08/18/17 1811          Lattie Haw, MD 08/19/17 1224

## 2017-08-18 NOTE — ED Triage Notes (Signed)
Patient has been here 08/07/17 for cough; it is now total 3 weeks and when he coughs he gets dizzy. No fever. Headache and pressure and fatigue. Has taken all rxs appropriately.

## 2017-08-18 NOTE — Discharge Instructions (Signed)
Begin Symbicort inhaler:  inhale one puff into the lungs twice daily. Continue Mucinex to loosen mucous.

## 2017-08-20 ENCOUNTER — Other Ambulatory Visit: Payer: Self-pay

## 2017-08-20 ENCOUNTER — Encounter: Payer: Self-pay | Admitting: *Deleted

## 2017-08-20 ENCOUNTER — Emergency Department (INDEPENDENT_AMBULATORY_CARE_PROVIDER_SITE_OTHER)
Admission: EM | Admit: 2017-08-20 | Discharge: 2017-08-20 | Disposition: A | Discharge status: Home / Self Care | Payer: 59 | Source: Home / Self Care

## 2017-08-20 DIAGNOSIS — R55 Syncope and collapse: Secondary | ICD-10-CM | POA: Diagnosis not present

## 2017-08-20 LAB — POCT CBC W AUTO DIFF (K'VILLE URGENT CARE)

## 2017-08-20 LAB — TIQ- AMBIGUOUS ORDER

## 2017-08-20 LAB — TIQ-NTM

## 2017-08-20 NOTE — ED Provider Notes (Signed)
[facility]    CSN: [id number] Arrival date & time: 08/20/17  1704     History   Chief Complaint Chief Complaint  Patient presents with  . Near Syncope    HPI Christian Orr is a 52 y.o. male.   The history is provided by the patient. No language interpreter was used.  Near Syncope  This is a new problem. The current episode started less than 1 hour ago. The problem occurs constantly. The problem has not changed since onset.Associated symptoms include chest pain. Nothing aggravates the symptoms. Nothing relieves the symptoms. He has tried nothing for the symptoms. The treatment provided no relief.  Pt has had a cough for 3 weeks.  Pt reports he was driving today and became sweaty and felt like he was going to black out.  Pt reports is vision was going dark.  Pt was able to stop the car.  Pt reports he became increasingly Orr of breath.  Pt reports tingling in his arm  Chest tightness and strange tingling in arms Pt reports left hand still feels strange.  Past Medical History:  Diagnosis Date  . Anxiety   . Depression   . Hypertension     Patient Active Problem List   Diagnosis Date Noted  . ATRIAL TACHYCARDIA 06/28/2010    Past Surgical History:  Procedure Laterality Date  . KIDNEY STONE SURGERY         Home Medications    Prior to Admission medications   Medication Sig Start Date End Date Taking? Authorizing Provider  AMLODIPINE BESYLATE PO Take by mouth.    [provider]  azithromycin (ZITHROMAX Z-PAK) 250 MG tablet Take 2 tabs today; then begin one tab once daily for 4 more days. 08/18/17   Lattie HawBeese, Stephen A, MD  benzonatate (TESSALON) 200 MG capsule Take one cap by mouth at bedtime as needed for cough.  May repeat in 4 to 6 hours 08/07/17   Lattie HawBeese, Stephen A, MD  doxycycline (VIBRAMYCIN) 100 MG capsule Take 1 capsule (100 mg total) by mouth 2 (two) times daily. Take with food. 08/07/17   Lattie HawBeese, Stephen A, MD  guaiFENesin-codeine 100-10 MG/5ML  syrup Take 10mL by mouth at bedtime as needed for cough.  May repeat dose in 4 to 6 hours. 08/18/17   Lattie HawBeese, Stephen A, MD  LOSARTAN POTASSIUM PO Take by mouth.    [provider]  METOPROLOL TARTRATE PO Take by mouth.    [provider]  predniSONE (DELTASONE) 20 MG tablet Take one tab by mouth twice daily for 4 days, then one daily for 3 days. Take with food. 08/07/17   Lattie HawBeese, Stephen A, MD    Family History History reviewed. No pertinent family history.  Social History Social History   Tobacco Use  . Smoking status: Never Smoker  . Smokeless tobacco: Never Used  Substance Use Topics  . Alcohol use: Yes    Comment: 2 q wk  . Drug use: No     Allergies   Patient has no known allergies.   Review of Systems Review of Systems  Cardiovascular: Positive for chest pain and near-syncope.  All other systems reviewed and are negative.    Physical Exam Triage Vital Signs ED Triage Vitals [08/20/17 1725]  Enc Vitals Group     BP (!) 141/85     Pulse Rate (!) 113     Resp 18     Temp 99.4 F (37.4 C)     Temp Source  Tympanic     SpO2 98 %     Weight 300 lb (136.1 kg)     Height 6' (1.829 m)     Head Circumference      Peak Flow      Pain Score 0     Pain Loc      Pain Edu?      Excl. in GC?    No data found.  Updated Vital Signs BP (!) 141/85 (BP Location: Right Arm)   Pulse (!) 113   Temp 99.4 F (37.4 C) (Tympanic)   Resp 18   Ht 6' (1.829 m)   Wt 300 lb (136.1 kg)   SpO2 98%   BMI 40.69 kg/m   Visual Acuity Right Eye Distance:   Left Eye Distance:   Bilateral Distance:    Right Eye Near:   Left Eye Near:    Bilateral Near:     Physical Exam  Constitutional: He appears well-developed.  obese  Eyes: Pupils are equal, round, and reactive to light.  Cardiovascular: Normal heart sounds.  tachycardia  Pulmonary/Chest: Effort normal and breath sounds normal.  coughing  Abdominal: Soft.  Musculoskeletal: Normal range of motion.    Neurological: He is alert.  Skin: Skin is warm.  Psychiatric: He has a normal mood and affect.     UC Treatments / Results  Labs (all labs ordered are listed, but only abnormal results are displayed) Labs Reviewed - No data to display  EKG  EKG Interpretation None       Radiology No results found.  Procedures Procedures (including critical care time)  Medications Ordered in UC Medications - No data to display   Initial Impression / Assessment and Plan / UC Course  I have reviewed the triage vital signs and the nursing notes.  Pertinent labs & imaging results that were available during my care of the patient were reviewed by me and considered in my medical decision making (see chart for details).     EKg  Sinus tach at 117, poor R wave progression. No st changes.   MDM Pt may have had vasovagal episode from coughing however continued arm tingling and chest tightness concerns me.   Ekg does not show acute Mi however I feel pt needs to have troponin checked to ruleout cardiac event.  [facility] to [facility] ED for evaluation   Final Clinical Impressions(s) / UC Diagnoses   Final diagnoses:  Near syncope    ED Discharge Orders    None       Controlled Substance Prescriptions [place] Controlled Substance Registry consulted? Not Applicable   Elson Areas, [place] 08/20/17 1740

## 2017-08-20 NOTE — ED Triage Notes (Signed)
Pt reports today at 1620 he was driving and had a near syncopal episode after coughing. He has had intermittent LT chest and arm tingling. He reports dizziness with coughing.

## 2017-08-20 NOTE — Discharge Instructions (Signed)
[facility] [place]

## 2017-08-22 ENCOUNTER — Telehealth: Payer: Self-pay

## 2017-08-22 NOTE — Telephone Encounter (Signed)
Spoke to pt. Stated he was headed to see his PCP later today. Advised pt to call if he has any more questions or concerns.

## 2017-08-30 ENCOUNTER — Telehealth: Payer: Self-pay | Admitting: *Deleted

## 2017-08-30 LAB — BORDETELLA PERTUSSIS PCR
B. PARAPERTUSSIS DNA: NOT DETECTED
B. PERTUSSIS DNA: NOT DETECTED

## 2017-08-30 LAB — BORDETELLA PERTUSSIS TOXIN (PT) AB ( IGG, IGA) , MAID
Bordetella pertussis toxin (PT) Ab (IgA): <1 IU/mL
Bordetella pertussis toxin (PT) Ab (IgG): 3 IU/mL

## 2017-08-30 LAB — TEST AUTHORIZATION

## 2017-08-30 NOTE — Telephone Encounter (Signed)
Callback: Patient informed of culture results. He is minimally improved. He has seen PCP and is soon being referred to a pulmonologist.

## 2017-11-17 IMAGING — DX DG CHEST 2V
2 series · 2 of 2 positions shown · non-contrast
Comparison: 09/18/2016.

CLINICAL DATA: Cough for 3 weeks. Right-sided chest pain yesterday.

EXAM:
CHEST  2 VIEW

[chest pa]
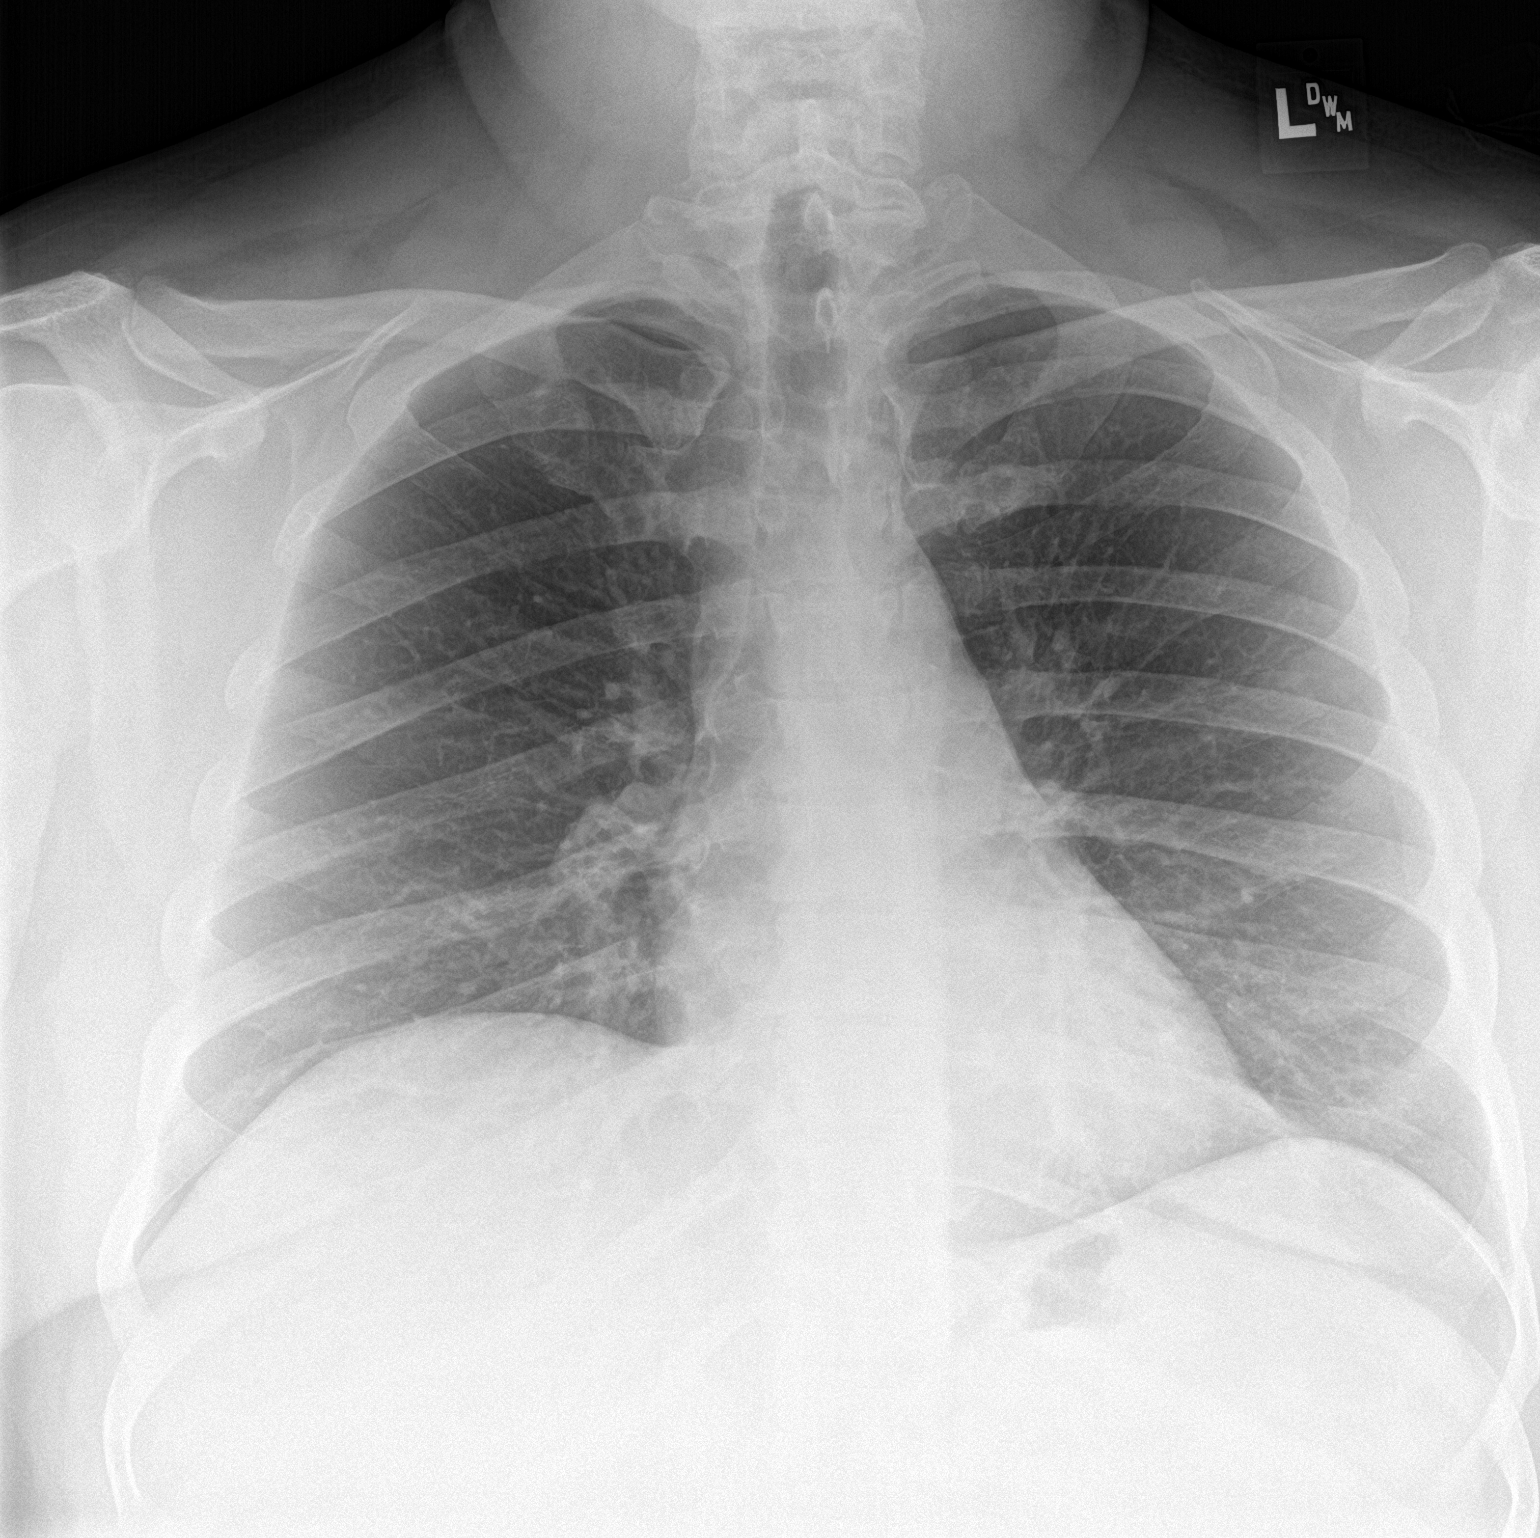

[chest lat]
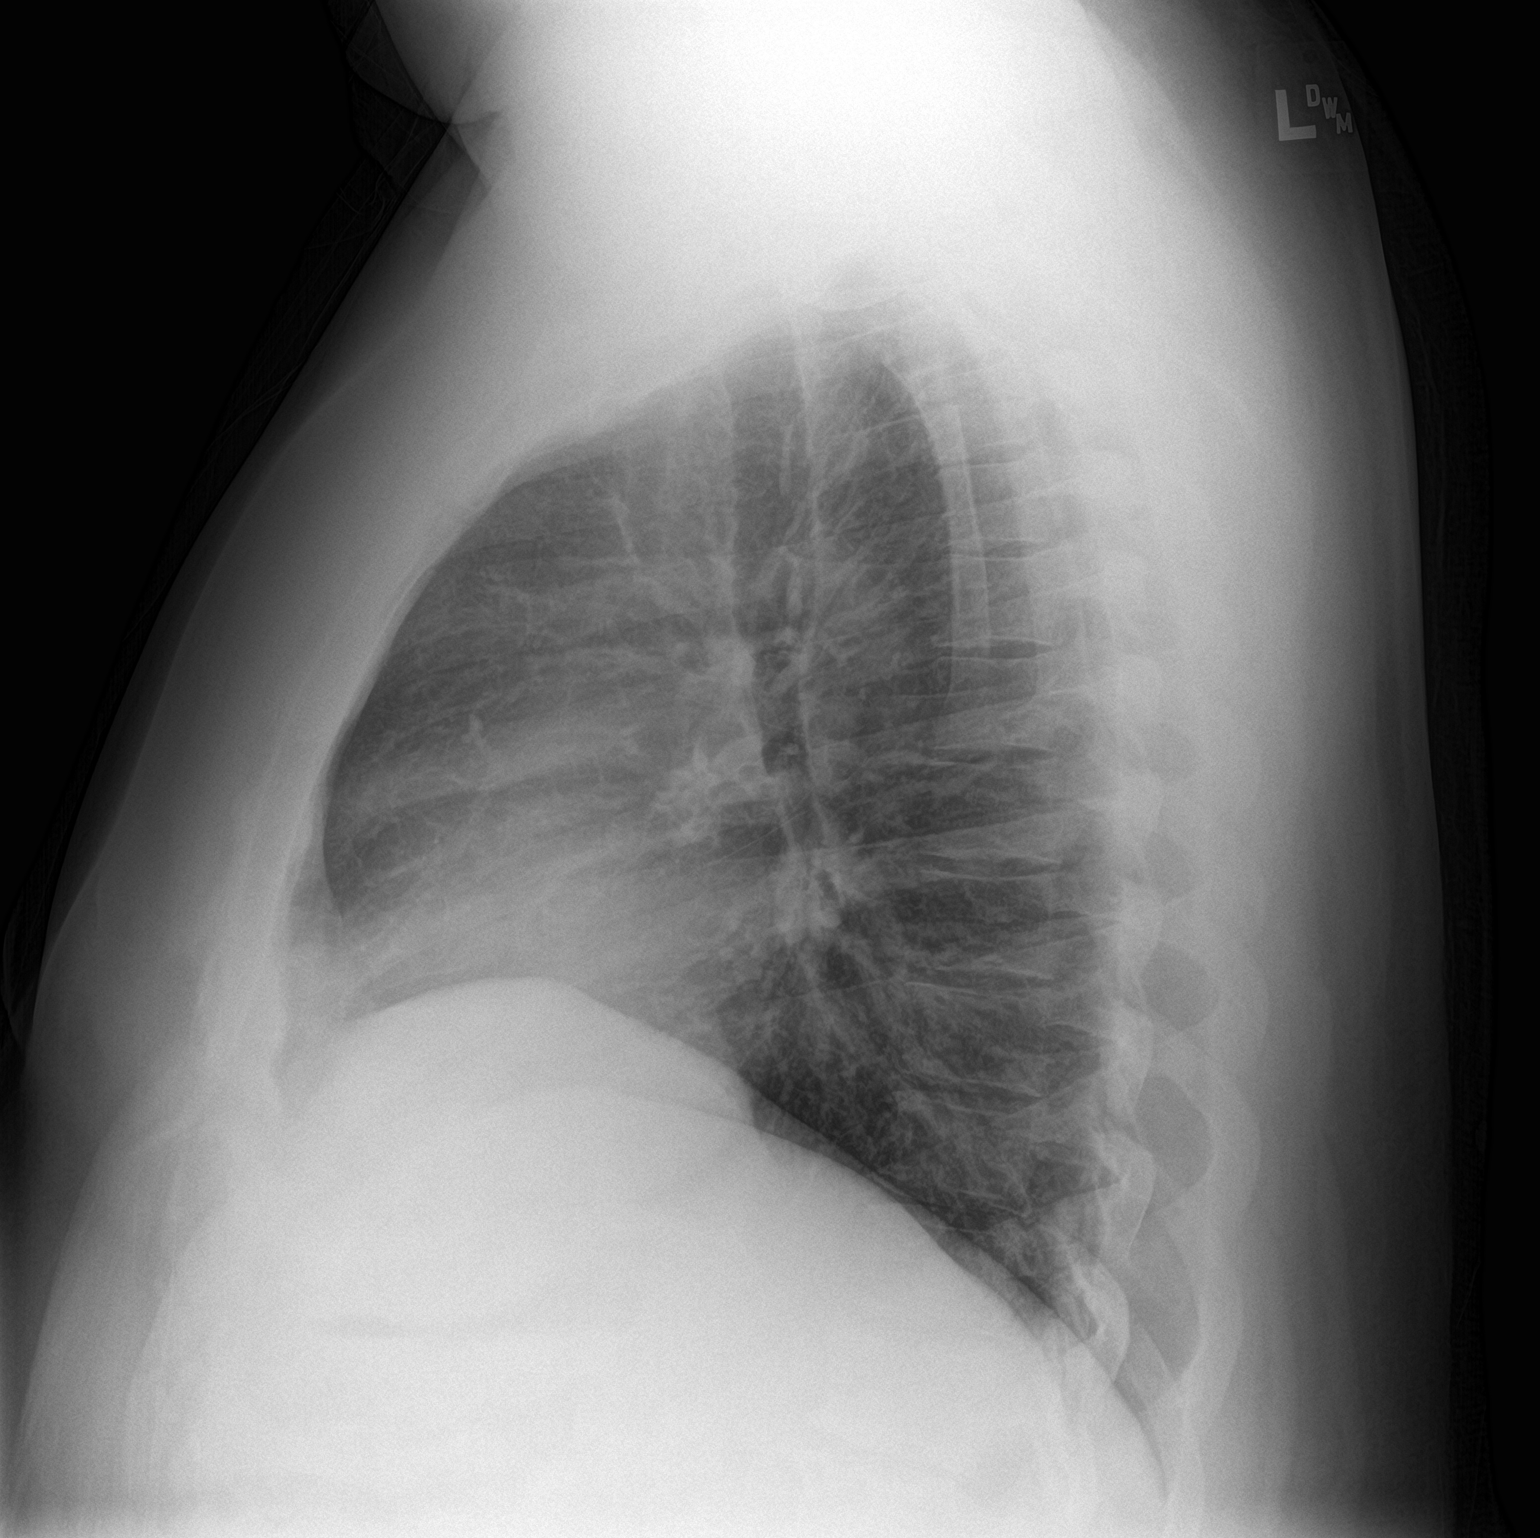

[2 of 2 positions shown; findings below may reference images not displayed]

FINDINGS: Mild lordotic positioning on the frontal examination. The heart size
and mediastinal contours are normal. The lungs are clear. There is
no pleural effusion or pneumothorax. No acute osseous findings are
identified.
IMPRESSION: Stable examination.  No active cardiopulmonary process.

## 2017-11-19 DIAGNOSIS — I10 Essential (primary) hypertension: Secondary | ICD-10-CM | POA: Insufficient documentation

## 2018-06-17 DIAGNOSIS — N200 Calculus of kidney: Secondary | ICD-10-CM | POA: Insufficient documentation

## 2018-06-17 DIAGNOSIS — R1032 Left lower quadrant pain: Secondary | ICD-10-CM | POA: Insufficient documentation

## 2019-02-21 DIAGNOSIS — Z1211 Encounter for screening for malignant neoplasm of colon: Secondary | ICD-10-CM | POA: Insufficient documentation

## 2020-03-20 ENCOUNTER — Encounter: Payer: Self-pay | Admitting: Emergency Medicine

## 2020-03-20 ENCOUNTER — Emergency Department
Admission: EM | Admit: 2020-03-20 | Discharge: 2020-03-20 | Disposition: A | Discharge status: Home / Self Care | Payer: 59 | Source: Home / Self Care

## 2020-03-20 ENCOUNTER — Other Ambulatory Visit: Payer: Self-pay

## 2020-03-20 DIAGNOSIS — B370 Candidal stomatitis: Secondary | ICD-10-CM | POA: Diagnosis not present

## 2020-03-20 DIAGNOSIS — J029 Acute pharyngitis, unspecified: Secondary | ICD-10-CM

## 2020-03-20 DIAGNOSIS — N200 Calculus of kidney: Secondary | ICD-10-CM | POA: Insufficient documentation

## 2020-03-20 HISTORY — DX: Calculus of kidney: N20.0

## 2020-03-20 LAB — POCT RAPID STREP A (OFFICE): Rapid Strep A Screen: NEGATIVE

## 2020-03-20 MED ORDER — FLUCONAZOLE 100 MG PO TABS: ORAL_TABLET | ORAL | 0 refills | Status: DC

## 2020-03-20 NOTE — ED Triage Notes (Signed)
Canker sores on gums  & throat pain since Tuesday  C/o pain to tongue  Wonders if he has thrush - eating causes pain Denies fever Gargling w/ salt water x 3 &  apple cider vinegar x 3 Took 2 amoxicillin last night from an old script No other OTC meds Pfizer vaccine

## 2020-03-20 NOTE — Discharge Instructions (Signed)
  Please take the medication as prescribed.  Call tomorrow to schedule a follow up appointment with your primary care provider this week for recheck of symptoms to make sure you are doing better.

## 2020-03-20 NOTE — ED Provider Notes (Signed)
[facility]    CSN: [id number] Arrival date & time: 03/20/20  1208      History   Chief Complaint Chief Complaint  Patient presents with  . Sore Throat    HPI Christian Orr is a 54 y.o. male.   HPI  Christian Orr is a 54 y.o. male presenting to UC with c/o 5 days of worsening mouth and throat pain, associated redness to his throat and gums as well as some white patches.  He has been gargling saltwater and used apple cider vinegar but that caused pain to worsen.  He took 2 leftover amoxicillin last night without relief. No hx of oral thrush in the past. No use of oral steroids recently.  His wife tested positive for yeast recently.  Pt able to swallow liquids. Denies difficulty breathing or swallowing. No fever, chills, n/v/d. No other symptoms.    Past Medical History:  Diagnosis Date  . Anxiety   . Depression   . Hypertension   . Kidney stones    x4    Patient Active Problem List   Diagnosis Date Noted  . Kidney stones   . Encounter for screening colonoscopy 02/21/2019  . Left lower quadrant abdominal pain 06/17/2018  . Renal stone 06/17/2018  . Essential hypertension 11/19/2017  . Right ureteral calculus 05/24/2015  . Sleep apnea 06/23/2013  . ATRIAL TACHYCARDIA 06/28/2010  . Other specified cardiac arrhythmias 06/28/2010    Past Surgical History:  Procedure Laterality Date  . KIDNEY STONE SURGERY         Home Medications    Prior to Admission medications   Medication Sig Start Date End Date Taking? Authorizing Provider  albuterol (VENTOLIN HFA) 108 (90 Base) MCG/ACT inhaler Inhale into the lungs. 01/31/18  Yes [provider]  budesonide-formoterol (SYMBICORT) 80-4.5 MCG/ACT inhaler Inhale into the lungs. 01/31/18  Yes [provider]  AMLODIPINE BESYLATE PO Take by mouth.    [provider]  azithromycin (ZITHROMAX Z-PAK) 250 MG tablet Take 2 tabs today; then begin one tab once daily for 4 more days. 08/18/17    Lattie Haw, MD  benzonatate (TESSALON) 200 MG capsule Take one cap by mouth at bedtime as needed for cough.  May repeat in 4 to 6 hours 08/07/17   Lattie Haw, MD  doxycycline (VIBRAMYCIN) 100 MG capsule Take 1 capsule (100 mg total) by mouth 2 (two) times daily. Take with food. 08/07/17   Lattie Haw, MD  fluconazole (DIFLUCAN) 100 MG tablet Take 2 tabs first day, then 1 tab daily for 13 days 03/20/20   Lurene Shadow, PA-C  guaiFENesin-codeine 100-10 MG/5ML syrup Take 66mL by mouth at bedtime as needed for cough.  May repeat dose in 4 to 6 hours. 08/18/17   Lattie Haw, MD  LOSARTAN POTASSIUM PO Take by mouth.    [provider]  METOPROLOL TARTRATE PO Take by mouth.    [provider]  predniSONE (DELTASONE) 20 MG tablet Take one tab by mouth twice daily for 4 days, then one daily for 3 days. Take with food. 08/07/17   Lattie Haw, MD    Family History Family History  Problem Relation Age of Onset  . Healthy Mother   . Cancer Father   . Healthy Sister     Social History Social History   Tobacco Use  . Smoking status: Never Smoker  . Smokeless tobacco: Never Used  Vaping Use  . Vaping Use: Never used  Substance Use Topics  . Alcohol use: Yes    Comment: 2 q wk  . Drug use: No     Allergies   Other   Review of Systems Review of Systems  Constitutional: Negative for chills and fever.  HENT: Positive for dental problem ( mouth pain), mouth sores and sore throat. Negative for congestion, ear pain, trouble swallowing and voice change.   Respiratory: Negative for cough and shortness of breath.   Cardiovascular: Negative for chest pain and palpitations.  Gastrointestinal: Negative for abdominal pain, diarrhea, nausea and vomiting.  Musculoskeletal: Negative for arthralgias, back pain and myalgias.  Skin: Negative for rash.  All other systems reviewed and are negative.    Physical Exam Triage Vital Signs ED Triage Vitals  Enc Vitals  Group     BP 03/20/20 1321 (!) 137/95     Pulse Rate 03/20/20 1321 91     Resp 03/20/20 1321 17     Temp 03/20/20 1321 98.5 F (36.9 C)     Temp Source 03/20/20 1321 Oral     SpO2 03/20/20 1321 95 %     Weight --      Height --      Head Circumference --      Peak Flow --      Pain Score 03/20/20 1326 4     Pain Loc --      Pain Edu? --      Excl. in GC? --    No data found.  Updated Vital Signs BP (!) 137/95 (BP Location: Left Arm)   Pulse 91   Temp 98.5 F (36.9 C) (Oral)   Resp 17   SpO2 95%   Visual Acuity Right Eye Distance:   Left Eye Distance:   Bilateral Distance:    Right Eye Near:   Left Eye Near:    Bilateral Near:     Physical Exam Vitals and nursing note reviewed.  Constitutional:      General: He is not in acute distress.    Appearance: He is well-developed. He is not ill-appearing, toxic-appearing or diaphoretic.  HENT:     Head: Normocephalic and atraumatic.     Right Ear: Tympanic membrane and ear canal normal.     Left Ear: Tympanic membrane and ear canal normal.     Nose: Nose normal.     Right Sinus: No maxillary sinus tenderness or frontal sinus tenderness.     Left Sinus: No maxillary sinus tenderness or frontal sinus tenderness.     Mouth/Throat:     Lips: Pink.     Mouth: Mucous membranes are moist. Oral lesions present.     Dentition: Gingival swelling and dental caries present.     Tongue: Lesions present.     Pharynx: Oropharynx is clear. Uvula midline. Posterior oropharyngeal erythema present. No pharyngeal swelling, oropharyngeal exudate or uvula swelling.     Comments: Erythema with white patches, mild edema and tenderness to gums and tongue. posterior pharynx erythema.   Cardiovascular:     Rate and Rhythm: Normal rate.  Pulmonary:     Effort: Pulmonary effort is normal.  Musculoskeletal:        General: Normal range of motion.     Cervical back: Normal range of motion.  Skin:    General: Skin is warm and dry.    Neurological:     Mental Status: He is alert and oriented to person, place, and time.  Psychiatric:        Behavior:  Behavior normal.      UC Treatments / Results  Labs (all labs ordered are listed, but only abnormal results are displayed) Labs Reviewed  POCT RAPID STREP A (OFFICE)    EKG   Radiology No results found.  Procedures Procedures (including critical care time)  Medications Ordered in UC Medications - No data to display  Initial Impression / Assessment and Plan / UC Course  I have reviewed the triage vital signs and the nursing notes.  Pertinent labs & imaging results that were available during my care of the patient were reviewed by me and considered in my medical decision making (see chart for details).    Rapid strep: NEGATIVE Hx and exam c/w oral thrush Rx: diflucan Encouraged f/u with PCP this week for recheck of symptoms.   Final Clinical Impressions(s) / UC Diagnoses   Final diagnoses:  Oral candidiasis  Pharyngitis, unspecified etiology     Discharge Instructions      Please take the medication as prescribed.  Call tomorrow to schedule a follow up appointment with your primary care provider this week for recheck of symptoms to make sure you are doing better.     ED Prescriptions    Medication Sig Dispense Auth. Provider   fluconazole (DIFLUCAN) 100 MG tablet Take 2 tabs first day, then 1 tab daily for 13 days 15 tablet Lurene Shadow, PA-C     PDMP not reviewed this encounter.   Lurene Shadow, PA-C 03/20/20 1416

## 2020-06-30 ENCOUNTER — Emergency Department (INDEPENDENT_AMBULATORY_CARE_PROVIDER_SITE_OTHER): Payer: 59

## 2020-06-30 ENCOUNTER — Other Ambulatory Visit: Payer: Self-pay

## 2020-06-30 ENCOUNTER — Emergency Department
Admission: EM | Admit: 2020-06-30 | Discharge: 2020-06-30 | Disposition: A | Discharge status: Home / Self Care | Payer: 59 | Source: Home / Self Care

## 2020-06-30 DIAGNOSIS — R059 Cough, unspecified: Secondary | ICD-10-CM

## 2020-06-30 DIAGNOSIS — U071 COVID-19: Secondary | ICD-10-CM | POA: Diagnosis not present

## 2020-06-30 DIAGNOSIS — J029 Acute pharyngitis, unspecified: Secondary | ICD-10-CM

## 2020-06-30 DIAGNOSIS — B370 Candidal stomatitis: Secondary | ICD-10-CM

## 2020-06-30 MED ORDER — FLUCONAZOLE 100 MG PO TABS
ORAL_TABLET | ORAL | 0 refills | Status: DC
Start: 2020-06-30 — End: 2024-01-16

## 2020-06-30 MED ORDER — BENZONATATE 100 MG PO CAPS: 100.0000 mg | ORAL_CAPSULE | Freq: Three times a day (TID) | ORAL | 0 refills | Status: DC

## 2020-06-30 NOTE — ED Triage Notes (Signed)
Pt presents to Urgent Care with c/o continued cough and sore throat following COVID dx 8 days ago. Also c/o tongue pain and "tingling" x approx 4 days.

## 2020-06-30 NOTE — Discharge Instructions (Signed)
  Be sure to follow up with family medicine next week if not improving.  CDC recommends isolation for 5 days from symptom onset and fever free for 24 hours without use of fever reducing medication and symptoms improving. Then wear a well fitting mask for 5 days when around others. If not improving, isolate for 10 days and consider reevaluation by a medical provider to rule out a secondary bacterial infection.   Call 911 or have someone drive you to the hospital if symptoms significantly worsening.

## 2020-06-30 NOTE — ED Provider Notes (Signed)
[facility]    CSN: [id number] Arrival date & time: 06/30/20  1240      History   Chief Complaint Chief Complaint  Patient presents with  . Cough    COVID+  . Tongue pain  . Sore Throat    HPI Christian Orr is a 55 y.o. male.   HPI  Christian Orr is a 55 y.o. male presenting to UC with c/o cough, congestion, and sore throat for about 8 days since being dx with COVID, 4 days of tingling of tongue. Hx of oral candidiasis in the past and states his wife has yeast infection again. Pt is not sure if symptoms are COVID, strep, or yeast. He does use a CPAP machine at night. Denies fever, chills, n/v/d.   Past Medical History:  Diagnosis Date  . Anxiety   . Depression   . Hypertension   . Kidney stones    x4    Patient Active Problem List   Diagnosis Date Noted  . Kidney stones   . Encounter for screening colonoscopy 02/21/2019  . Left lower quadrant abdominal pain 06/17/2018  . Renal stone 06/17/2018  . Essential hypertension 11/19/2017  . Right ureteral calculus 05/24/2015  . Sleep apnea 06/23/2013  . ATRIAL TACHYCARDIA 06/28/2010  . Other specified cardiac arrhythmias 06/28/2010    Past Surgical History:  Procedure Laterality Date  . KIDNEY STONE SURGERY         Home Medications    Prior to Admission medications   Medication Sig Start Date End Date Taking? Authorizing Provider  benzonatate (TESSALON) 100 MG capsule Take 1 capsule (100 mg total) by mouth every 8 (eight) hours. 06/30/20  Yes Jessey Huyett O, PA-C  guaifenesin (ROBITUSSIN) 100 MG/5ML syrup Take 200 mg by mouth 3 (three) times daily as needed for cough.   Yes [provider]  Multiple Vitamin (MULTIVITAMIN) capsule Take 1 capsule by mouth daily.   Yes [provider]  albuterol (VENTOLIN HFA) 108 (90 Base) MCG/ACT inhaler Inhale into the lungs. 01/31/18   [provider]  AMLODIPINE BESYLATE PO Take by mouth.    [provider]  azithromycin  (ZITHROMAX Z-PAK) 250 MG tablet Take 2 tabs today; then begin one tab once daily for 4 more days. 08/18/17   Lattie Haw, MD  budesonide-formoterol (SYMBICORT) 80-4.5 MCG/ACT inhaler Inhale into the lungs. 01/31/18   [provider]  doxycycline (VIBRAMYCIN) 100 MG capsule Take 1 capsule (100 mg total) by mouth 2 (two) times daily. Take with food. 08/07/17   Lattie Haw, MD  fluconazole (DIFLUCAN) 100 MG tablet Take 2 tabs first day, then 1 tab daily for 13 days 06/30/20   Lurene Shadow, PA-C  guaiFENesin-codeine 100-10 MG/5ML syrup Take 82mL by mouth at bedtime as needed for cough.  May repeat dose in 4 to 6 hours. 08/18/17   Lattie Haw, MD  LOSARTAN POTASSIUM PO Take by mouth.    [provider]  METOPROLOL TARTRATE PO Take by mouth.    [provider]  predniSONE (DELTASONE) 20 MG tablet Take one tab by mouth twice daily for 4 days, then one daily for 3 days. Take with food. 08/07/17   Lattie Haw, MD    Family History Family History  Problem Relation Age of Onset  . Healthy Mother   . Cancer Father   . Healthy Sister     Social History Social History   Tobacco Use  . Smoking status: Never Smoker  .  Smokeless tobacco: Never Used  Vaping Use  . Vaping Use: Never used  Substance Use Topics  . Alcohol use: Yes    Comment: 2 q wk  . Drug use: No     Allergies   Other and Hydrocodone-homatropine   Review of Systems Review of Systems  Constitutional: Negative for chills and fever.  HENT: Positive for congestion and sore throat. Negative for ear pain, trouble swallowing and voice change.   Respiratory: Positive for cough. Negative for shortness of breath.   Cardiovascular: Negative for chest pain and palpitations.  Gastrointestinal: Negative for abdominal pain, diarrhea, nausea and vomiting.  Musculoskeletal: Negative for arthralgias, back pain and myalgias.  Skin: Negative for rash.  Neurological: Negative for dizziness,  light-headedness and headaches.  All other systems reviewed and are negative.    Physical Exam Triage Vital Signs ED Triage Vitals  Enc Vitals Group     BP 06/30/20 1325 136/88     Pulse Rate 06/30/20 1325 (!) 108     Resp 06/30/20 1325 20     Temp 06/30/20 1325 98.3 F (36.8 C)     Temp Source 06/30/20 1325 Oral     SpO2 06/30/20 1325 97 %     Weight 06/30/20 1317 (!) 313 lb (142 kg)     Height 06/30/20 1317 6\' 1"  (1.854 m)     Head Circumference --      Peak Flow --      Pain Score 06/30/20 1316 4     Pain Loc --      Pain Edu? --      Excl. in GC? --    No data found.  Updated Vital Signs BP 136/88 (BP Location: Right Arm)   Pulse (!) 108   Temp 98.3 F (36.8 C) (Oral)   Resp 20   Ht 6\' 1"  (1.854 m)   Wt (!) 313 lb (142 kg)   SpO2 97%   BMI 41.30 kg/m   Visual Acuity Right Eye Distance:   Left Eye Distance:   Bilateral Distance:    Right Eye Near:   Left Eye Near:    Bilateral Near:     Physical Exam Vitals and nursing note reviewed.  Constitutional:      General: He is not in acute distress.    Appearance: He is well-developed and well-nourished. He is not ill-appearing, toxic-appearing or diaphoretic.  HENT:     Head: Normocephalic and atraumatic.     Right Ear: Tympanic membrane and ear canal normal.     Left Ear: Tympanic membrane and ear canal normal.     Nose: Nose normal.     Right Sinus: No maxillary sinus tenderness or frontal sinus tenderness.     Left Sinus: No maxillary sinus tenderness or frontal sinus tenderness.     Mouth/Throat:     Lips: Pink.     Mouth: Mucous membranes are moist.     Tongue: Lesions (erythematous, thin white film) present.     Pharynx: Oropharynx is clear. Uvula midline. Posterior oropharyngeal erythema and uvula swelling present. No pharyngeal swelling or oropharyngeal exudate.     Tonsils: No tonsillar exudate or tonsillar abscesses.  Eyes:     Extraocular Movements: EOM normal.  Cardiovascular:     Rate and  Rhythm: Normal rate and regular rhythm.  Pulmonary:     Effort: Pulmonary effort is normal. No respiratory distress.     Breath sounds: Normal breath sounds. No stridor. No wheezing, rhonchi or rales.  Musculoskeletal:  General: Normal range of motion.     Cervical back: Normal range of motion and neck supple.  Lymphadenopathy:     Cervical: No cervical adenopathy.  Skin:    General: Skin is warm and dry.  Neurological:     Mental Status: He is alert and oriented to person, place, and time.  Psychiatric:        Mood and Affect: Mood and affect normal.        Behavior: Behavior normal.      UC Treatments / Results  Labs (all labs ordered are listed, but only abnormal results are displayed) Labs Reviewed  POCT RAPID STREP A (OFFICE)    EKG   Radiology DG Chest 2 View  Result Date: 06/30/2020 CLINICAL DATA:  Cough EXAM: CHEST - 2 VIEW COMPARISON:  August 18, 2017 FINDINGS: The cardiomediastinal silhouette is unchanged in contour. No pleural effusion. No pneumothorax. No acute pleuroparenchymal abnormality. Visualized abdomen is unremarkable. Multilevel degenerative changes of the thoracic spine. IMPRESSION: No acute cardiopulmonary abnormality. Electronically Signed   By: Meda Klinefelter MD   On: 06/30/2020 14:12    Procedures Procedures (including critical care time)  Medications Ordered in UC Medications - No data to display  Initial Impression / Assessment and Plan / UC Course  I have reviewed the triage vital signs and the nursing notes.  Pertinent labs & imaging results that were available during my care of the patient were reviewed by me and considered in my medical decision making (see chart for details).     Rapid strep: NEGATIVE CXR: WNL Will cover for oral candida again Encouraged f/u with PCP  AVS given  Final Clinical Impressions(s) / UC Diagnoses   Final diagnoses:  COVID  Oral candida  Acute pharyngitis, unspecified etiology  Cough      Discharge Instructions      Be sure to follow up with family medicine next week if not improving.  CDC recommends isolation for 5 days from symptom onset and fever free for 24 hours without use of fever reducing medication and symptoms improving. Then wear a well fitting mask for 5 days when around others. If not improving, isolate for 10 days and consider reevaluation by a medical provider to rule out a secondary bacterial infection.   Call 911 or have someone drive you to the hospital if symptoms significantly worsening.     ED Prescriptions    Medication Sig Dispense Auth. Provider   benzonatate (TESSALON) 100 MG capsule Take 1 capsule (100 mg total) by mouth every 8 (eight) hours. 21 capsule Doroteo Glassman, Evamae Rowen O, PA-C   fluconazole (DIFLUCAN) 100 MG tablet Take 2 tabs first day, then 1 tab daily for 13 days 15 tablet Lurene Shadow, [place]     PDMP not reviewed this encounter.   Lurene Shadow, [place] 06/30/20 1711

## 2021-08-10 IMAGING — DX DG CHEST 2V
2 series · 2 of 2 positions shown · non-contrast
Comparison: August 18, 2017

CLINICAL DATA: Cough

EXAM:
CHEST - 2 VIEW

[chest pa]
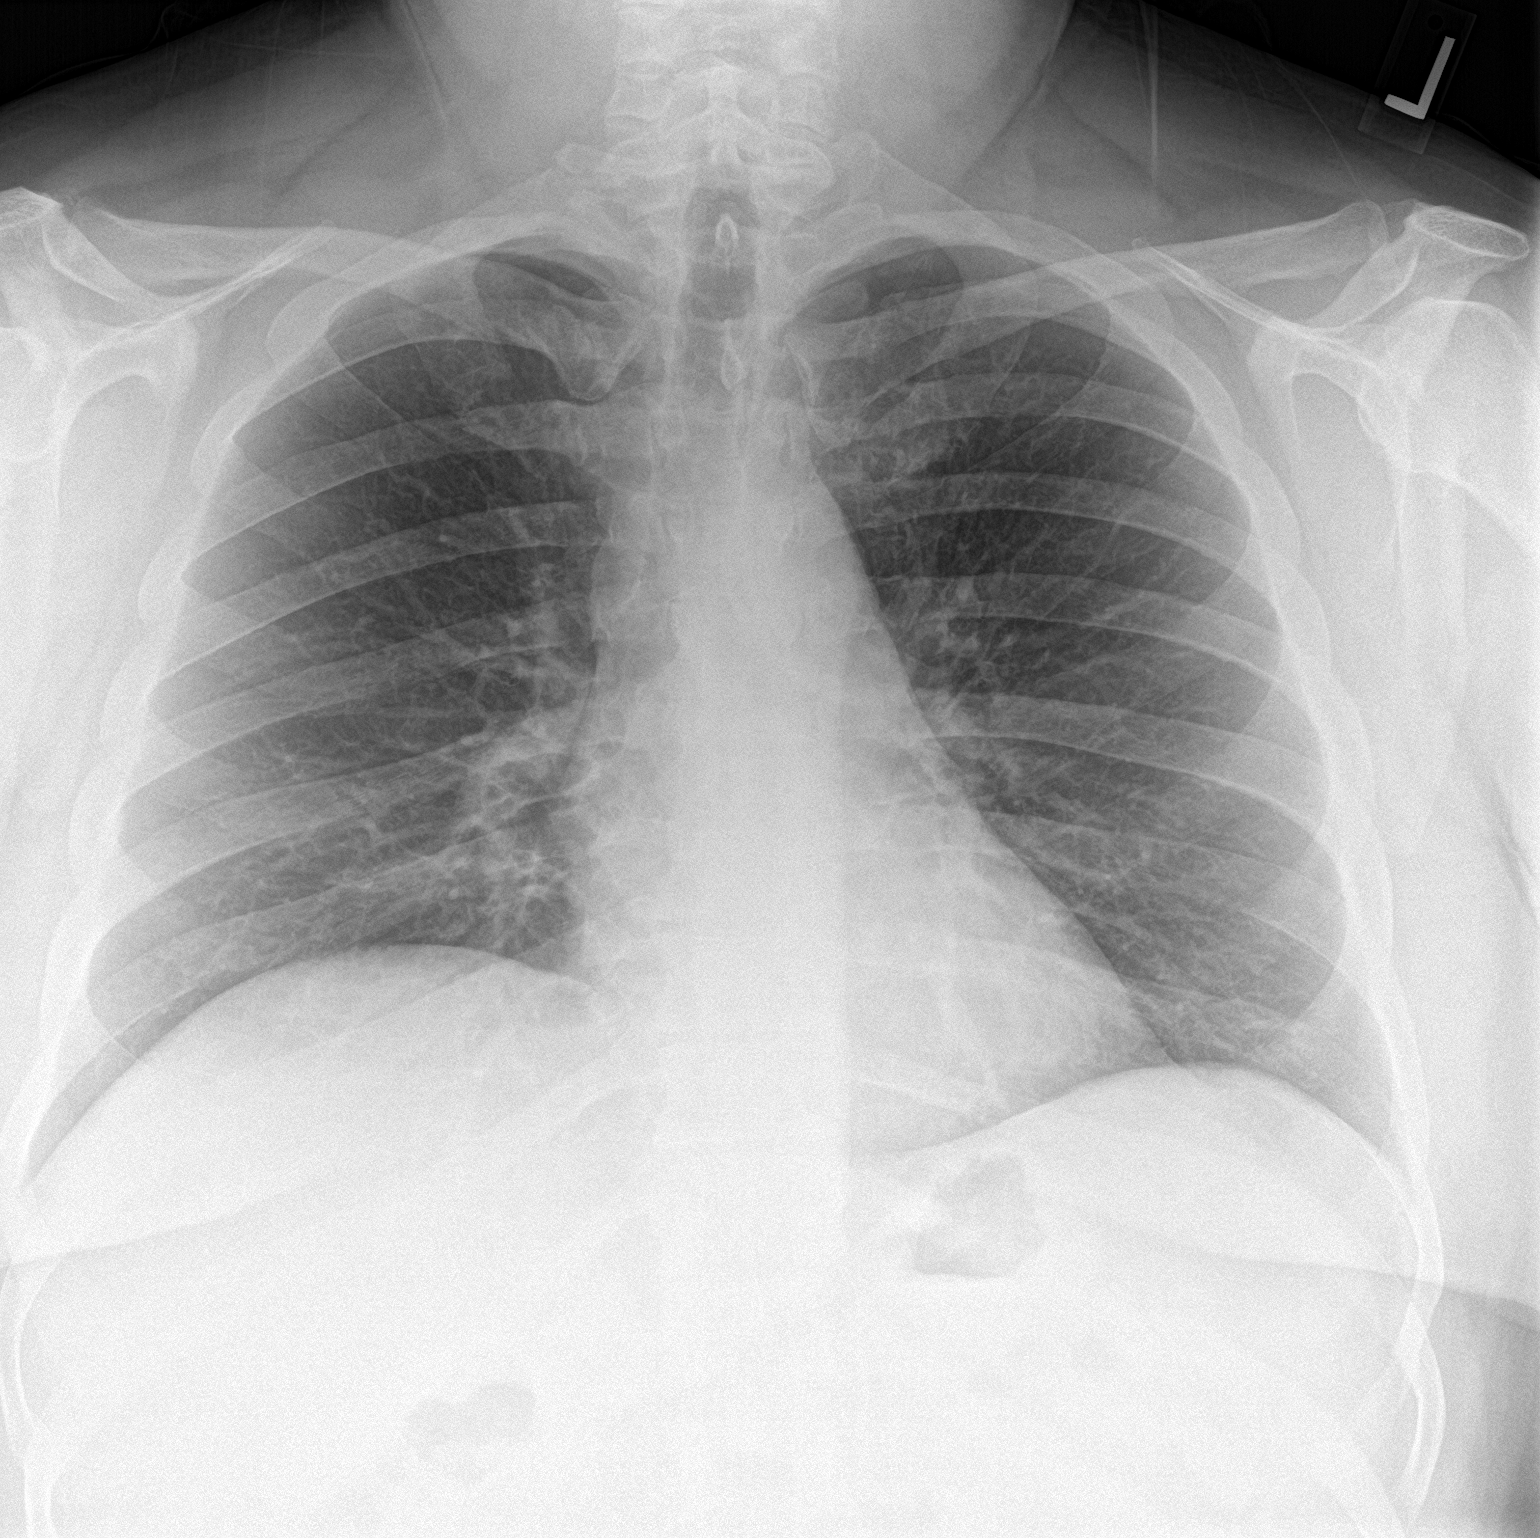

[chest lat]
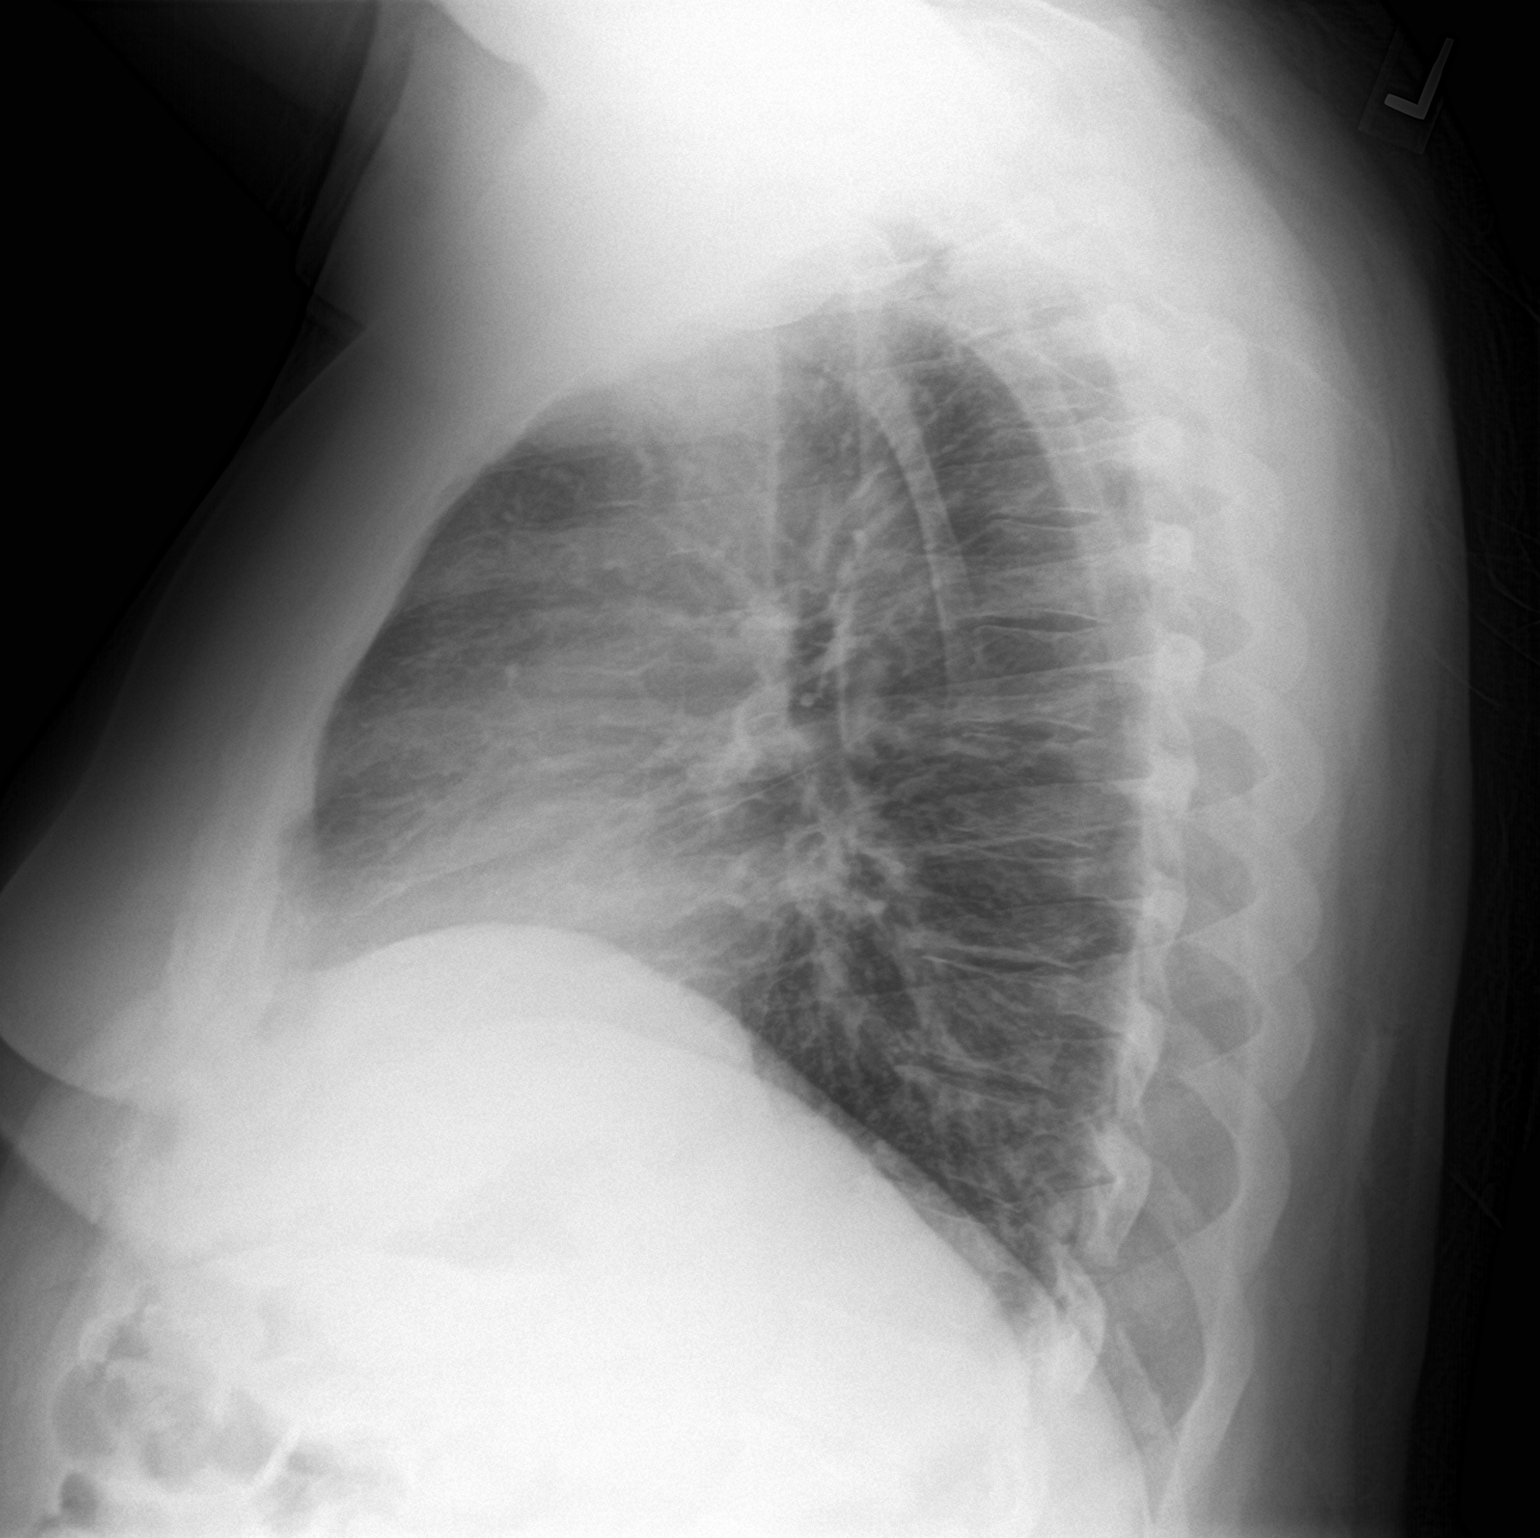

[2 of 2 positions shown; findings below may reference images not displayed]

FINDINGS: The cardiomediastinal silhouette is unchanged in contour. No pleural
effusion. No pneumothorax. No acute pleuroparenchymal abnormality.
Visualized abdomen is unremarkable. Multilevel degenerative changes
of the thoracic spine.
IMPRESSION: No acute cardiopulmonary abnormality.

## 2024-01-16 ENCOUNTER — Other Ambulatory Visit: Payer: Self-pay

## 2024-01-16 ENCOUNTER — Ambulatory Visit: Admission: EM | Admit: 2024-01-16 | Discharge: 2024-01-16 | Disposition: A | Discharge status: Home / Self Care

## 2024-01-16 DIAGNOSIS — J0100 Acute maxillary sinusitis, unspecified: Secondary | ICD-10-CM

## 2024-01-16 DIAGNOSIS — J309 Allergic rhinitis, unspecified: Secondary | ICD-10-CM | POA: Diagnosis not present

## 2024-01-16 DIAGNOSIS — R059 Cough, unspecified: Secondary | ICD-10-CM | POA: Diagnosis not present

## 2024-01-16 MED ORDER — FEXOFENADINE HCL 180 MG PO TABS: 180.0000 mg | ORAL_TABLET | Freq: Every day | ORAL | 0 refills | Status: AC

## 2024-01-16 MED ORDER — PROMETHAZINE-DM 6.25-15 MG/5ML PO SYRP: 5.0000 mL | ORAL_SOLUTION | Freq: Two times a day (BID) | ORAL | 0 refills | Status: AC | PRN

## 2024-01-16 MED ORDER — AMOXICILLIN-POT CLAVULANATE 875-125 MG PO TABS: 1.0000 | ORAL_TABLET | Freq: Two times a day (BID) | ORAL | 0 refills | Status: AC

## 2024-01-16 MED ORDER — PREDNISONE 20 MG PO TABS: ORAL_TABLET | ORAL | 0 refills | Status: AC
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
## 2024-01-16 NOTE — Discharge Instructions (Addendum)
 Advised patient to take medications as directed with food to completion.  Advised to take prednisone  and Allegra  with first dose of Augmentin  for the next 5 of 7 days.  Advised may use promethazine  cough syrup at night prior to sleep for cough due to sedative effects.  Encouraged increase daily water intake to 64 ounces per day while taking these medications.  Advised if symptoms worsen and/or unresolved please follow-up with PCP or here for further evaluation.

## 2024-01-16 NOTE — ED Provider Notes (Signed)
 [facility]    CSN: [id number] Arrival date & time: 01/16/24  1849      History   Chief Complaint Chief Complaint  Patient presents with   URI    HPI Christian Orr is a 58 y.o. male.   HPI 58 year old male presents with sniffling, congestion, cough for 5 days.  PMH significant for morbid obesity, HTN, and kidney stones.  Past Medical History:  Diagnosis Date   Anxiety    Depression    Hypertension    Kidney stones    x4    Patient Active Problem List   Diagnosis Date Noted   Kidney stones    Encounter for screening colonoscopy 02/21/2019   Left lower quadrant abdominal pain 06/17/2018   Renal stone 06/17/2018   Essential hypertension 11/19/2017   Right ureteral calculus 05/24/2015   Sleep apnea 06/23/2013   ATRIAL TACHYCARDIA 06/28/2010   Other specified cardiac arrhythmias 06/28/2010    Past Surgical History:  Procedure Laterality Date   KIDNEY STONE SURGERY         Home Medications    Prior to Admission medications   Medication Sig Start Date End Date Taking? Authorizing Provider  amoxicillin -clavulanate (AUGMENTIN ) 875-125 MG tablet Take 1 tablet by mouth every 12 (twelve) hours. 01/16/24  Yes Teddy Sharper, FNP  fexofenadine  (ALLEGRA  ALLERGY) 180 MG tablet Take 1 tablet (180 mg total) by mouth daily for 15 days. 01/16/24 01/31/24 Yes Teddy Sharper, FNP  predniSONE  (DELTASONE ) 20 MG tablet Take 3 tabs PO daily x 5 days. 01/16/24  Yes Teddy Sharper, FNP  promethazine -dextromethorphan (PROMETHAZINE -DM) 6.25-15 MG/5ML syrup Take 5 mLs by mouth 2 (two) times daily as needed for cough. 01/16/24  Yes Teddy Sharper, FNP  RYBELSUS 14 MG TABS Take 1 tablet by mouth daily. 01/09/24  Yes [provider]  albuterol  (VENTOLIN  HFA) 108 (90 Base) MCG/ACT inhaler Inhale into the lungs. 01/31/18   [provider]  budesonide-formoterol (SYMBICORT) 80-4.5 MCG/ACT inhaler Inhale into the lungs. 01/31/18   [provider]  LOSARTAN  POTASSIUM PO Take by mouth.    [provider]    Family History Family History  Problem Relation Age of Onset   Healthy Mother    Cancer Father    Healthy Sister     Social History Social History   Tobacco Use   Smoking status: Never   Smokeless tobacco: Never  Vaping Use   Vaping status: Never Used  Substance Use Topics   Alcohol use: Yes    Comment: 2 q wk   Drug use: No     Allergies   Other and Hydrocodone bit-homatrop mbr   Review of Systems Review of Systems  HENT:  Positive for congestion, postnasal drip, rhinorrhea, sinus pressure and sneezing.   Respiratory:  Positive for cough.   All other systems reviewed and are negative.    Physical Exam Triage Vital Signs ED Triage Vitals [01/16/24 1927]  Encounter Vitals Group     BP      Girls Systolic BP Percentile      Girls Diastolic BP Percentile      Boys Systolic BP Percentile      Boys Diastolic BP Percentile      Pulse      Resp      Temp      Temp src      SpO2      Weight      Height      Head Circumference  Peak Flow      Pain Score 0     Pain Loc      Pain Education      Exclude from Growth Chart    No data found.  Updated Vital Signs BP (!) 178/90 (BP Location: Right Arm)   Pulse (!) 118   Temp 98.9 F (37.2 C)   Resp 19   SpO2 94%   Visual Acuity Right Eye Distance:   Left Eye Distance:   Bilateral Distance:    Right Eye Near:   Left Eye Near:    Bilateral Near:     Physical Exam Vitals and nursing note reviewed.  Constitutional:      Appearance: Normal appearance. He is obese. He is ill-appearing.  HENT:     Head: Normocephalic and atraumatic.     Right Ear: Tympanic membrane and external ear normal.     Left Ear: Tympanic membrane and external ear normal.     Ears:     Comments: Significant eustachian tube dysfunction noted bilateral    Mouth/Throat:     Mouth: Mucous membranes are moist.     Pharynx: Oropharynx is clear.     Comments:  Significant amount of clear drainage of posterior oropharynx noted Eyes:     Extraocular Movements: Extraocular movements intact.     Pupils: Pupils are equal, round, and reactive to light.  Cardiovascular:     Rate and Rhythm: Normal rate and regular rhythm.     Pulses: Normal pulses.     Heart sounds: Normal heart sounds.  Pulmonary:     Effort: Pulmonary effort is normal.     Breath sounds: Normal breath sounds. No wheezing, rhonchi or rales.     Comments: Infrequent nonproductive cough on exam Musculoskeletal:        General: Normal range of motion.     Cervical back: Normal range of motion and neck supple.  Skin:    General: Skin is warm and dry.  Neurological:     General: No focal deficit present.     Mental Status: He is alert and oriented to person, place, and time. Mental status is at baseline.  Psychiatric:        Mood and Affect: Mood normal.        Behavior: Behavior normal.      UC Treatments / Results  Labs (all labs ordered are listed, but only abnormal results are displayed) Labs Reviewed - No data to display  EKG   Radiology No results found.  Procedures Procedures (including critical care time)  Medications Ordered in UC Medications - No data to display  Initial Impression / Assessment and Plan / UC Course  I have reviewed the triage vital signs and the nursing notes.  Pertinent labs & imaging results that were available during my care of the patient were reviewed by me and considered in my medical decision making (see chart for details).     MDM: 1.  Acute maxillary sinusitis, recurrence not specified-Rx'd Augmentin  875/125 mg tablet: Take 1 tablet twice daily x 7 days; 2.  Cough, unspecified type, Rx'd prednisone  20 mg tablet: Take 3 tabs p.o. daily x 5 days, Rx'd Promethazine  DM 6.25/15 mg / 5 mL syrup: Take 5 mL p.o. twice daily x 5 days; 3.  Allergic rhinitis, unspecified seasonality, unspecified trigger, Rx'd Allegra  180 mg tablet: Take 1  tablet daily x 5 days. Advised patient to take medications as directed with food to completion.  Advised to take prednisone   and Allegra  with first dose of Augmentin  for the next 5 of 7 days.  Advised may use promethazine  cough syrup at night prior to sleep for cough due to sedative effects.  Encouraged increase daily water intake to 64 ounces per day while taking these medications.  Advised if symptoms worsen and/or unresolved please follow-up with PCP or here for further evaluation.  Patient discharged home, hemodynamically stable Final Clinical Impressions(s) / UC Diagnoses   Final diagnoses:  Cough, unspecified type  Acute maxillary sinusitis, recurrence not specified  Allergic rhinitis, unspecified seasonality, unspecified trigger     Discharge Instructions      Advised patient to take medications as directed with food to completion.  Advised to take prednisone  and Allegra  with first dose of Augmentin  for the next 5 of 7 days.  Advised may use promethazine  cough syrup at night prior to sleep for cough due to sedative effects.  Encouraged increase daily water intake to 64 ounces per day while taking these medications.  Advised if symptoms worsen and/or unresolved please follow-up with PCP or here for further evaluation.     ED Prescriptions     Medication Sig Dispense Auth. Provider   amoxicillin -clavulanate (AUGMENTIN ) 875-125 MG tablet Take 1 tablet by mouth every 12 (twelve) hours. 14 tablet Elber Galyean, FNP   predniSONE  (DELTASONE ) 20 MG tablet Take 3 tabs PO daily x 5 days. 15 tablet Jaci Desanto, FNP   fexofenadine  (ALLEGRA  ALLERGY) 180 MG tablet Take 1 tablet (180 mg total) by mouth daily for 15 days. 15 tablet Tequisha Maahs, FNP   promethazine -dextromethorphan (PROMETHAZINE -DM) 6.25-15 MG/5ML syrup Take 5 mLs by mouth 2 (two) times daily as needed for cough. 118 mL Adilee Lemme, FNP      PDMP not reviewed this encounter.   Teddy Sharper, FNP 01/16/24 1947
